# Patient Record
Sex: Female | Born: 1960 | Race: White | Hispanic: No | Marital: Married | Smoking: Never smoker
Health system: Southern US, Community
[De-identification: ages and names within clinical notes are randomized; demographics above are authoritative.]

## PROBLEM LIST (undated history)

## (undated) DIAGNOSIS — R002 Palpitations: Secondary | ICD-10-CM

## (undated) DIAGNOSIS — G35 Multiple sclerosis: Secondary | ICD-10-CM

## (undated) DIAGNOSIS — F32A Depression, unspecified: Secondary | ICD-10-CM

## (undated) DIAGNOSIS — F329 Major depressive disorder, single episode, unspecified: Secondary | ICD-10-CM

## (undated) DIAGNOSIS — E785 Hyperlipidemia, unspecified: Secondary | ICD-10-CM

## (undated) DIAGNOSIS — I4891 Unspecified atrial fibrillation: Secondary | ICD-10-CM

## (undated) DIAGNOSIS — K635 Polyp of colon: Secondary | ICD-10-CM

## (undated) DIAGNOSIS — F419 Anxiety disorder, unspecified: Secondary | ICD-10-CM

## (undated) DIAGNOSIS — E039 Hypothyroidism, unspecified: Secondary | ICD-10-CM

## (undated) DIAGNOSIS — E559 Vitamin D deficiency, unspecified: Secondary | ICD-10-CM

## (undated) DIAGNOSIS — K219 Gastro-esophageal reflux disease without esophagitis: Secondary | ICD-10-CM

## (undated) DIAGNOSIS — M170 Bilateral primary osteoarthritis of knee: Secondary | ICD-10-CM

## (undated) DIAGNOSIS — I10 Essential (primary) hypertension: Secondary | ICD-10-CM

## (undated) DIAGNOSIS — I8390 Asymptomatic varicose veins of unspecified lower extremity: Secondary | ICD-10-CM

## (undated) HISTORY — DX: Polyp of colon: K63.5

## (undated) HISTORY — PX: GASTRIC BYPASS: SHX52

## (undated) HISTORY — DX: Morbid (severe) obesity due to excess calories: E66.01

## (undated) HISTORY — DX: Bilateral primary osteoarthritis of knee: M17.0

## (undated) HISTORY — DX: Vitamin D deficiency, unspecified: E55.9

## (undated) HISTORY — DX: Hyperlipidemia, unspecified: E78.5

## (undated) HISTORY — DX: Unspecified atrial fibrillation: I48.91

## (undated) HISTORY — DX: Palpitations: R00.2

## (undated) HISTORY — PX: CHOLECYSTECTOMY: SHX55

---
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
## 2013-04-17 ENCOUNTER — Encounter (HOSPITAL_COMMUNITY): Payer: Self-pay | Admitting: *Deleted

## 2013-04-17 NOTE — Progress Notes (Signed)
Completed preop instructions on 04/17/13.  [facility] in [place] for clarification of Estradiol , celebrex and progesterone.  Called Dr Sudie Bailey for ekg and stress test results from 2012.

## 2013-04-17 NOTE — Progress Notes (Signed)
Instructed patient to let Dr Charlann Boxer office be aware of sinus infection and started on Zpack 04/16/13.

## 2013-04-17 NOTE — Progress Notes (Signed)
Need orders in EPIC.  Surgery scheduled for 04/19/13.  Thank You.

## 2013-04-17 NOTE — Progress Notes (Signed)
Patient uses [facility] on [address] in [place].  [phone].

## 2013-04-18 NOTE — Progress Notes (Signed)
Surgery scheduled for 04/19/13.  Need orders in EPIC.  Thank You.  

## 2013-04-18 NOTE — Progress Notes (Signed)
Called orthopedic office to enter orders on patient.

## 2013-04-18 NOTE — Progress Notes (Signed)
Patient called back with name of nasal spray and left message on voice mail.  Placed info under home medications.

## 2013-04-19 ENCOUNTER — Encounter (HOSPITAL_COMMUNITY): Payer: Self-pay | Admitting: Pharmacy Technician

## 2013-04-19 ENCOUNTER — Encounter (HOSPITAL_COMMUNITY): Payer: BC Managed Care – PPO | Admitting: Anesthesiology

## 2013-04-19 ENCOUNTER — Ambulatory Visit (HOSPITAL_COMMUNITY): Payer: BC Managed Care – PPO

## 2013-04-19 ENCOUNTER — Encounter (HOSPITAL_COMMUNITY): Payer: Self-pay

## 2013-04-19 ENCOUNTER — Ambulatory Visit (HOSPITAL_COMMUNITY)
Admission: RE | Admit: 2013-04-19 | Discharge: 2013-04-19 | Disposition: A | Discharge status: Home / Self Care | Payer: BC Managed Care – PPO | Source: Ambulatory Visit | Attending: Orthopedic Surgery | Admitting: Orthopedic Surgery

## 2013-04-19 ENCOUNTER — Encounter (HOSPITAL_COMMUNITY)
Admission: RE | Disposition: A | Discharge status: Home / Self Care | Payer: Self-pay | Source: Ambulatory Visit | Attending: Orthopedic Surgery

## 2013-04-19 ENCOUNTER — Ambulatory Visit (HOSPITAL_COMMUNITY): Payer: BC Managed Care – PPO | Admitting: Anesthesiology

## 2013-04-19 DIAGNOSIS — I10 Essential (primary) hypertension: Secondary | ICD-10-CM | POA: Insufficient documentation

## 2013-04-19 DIAGNOSIS — G35 Multiple sclerosis: Secondary | ICD-10-CM | POA: Insufficient documentation

## 2013-04-19 DIAGNOSIS — M94262 Chondromalacia, left knee: Secondary | ICD-10-CM

## 2013-04-19 DIAGNOSIS — Z9884 Bariatric surgery status: Secondary | ICD-10-CM | POA: Insufficient documentation

## 2013-04-19 DIAGNOSIS — X58XXXA Exposure to other specified factors, initial encounter: Secondary | ICD-10-CM | POA: Insufficient documentation

## 2013-04-19 DIAGNOSIS — M2240 Chondromalacia patellae, unspecified knee: Secondary | ICD-10-CM | POA: Insufficient documentation

## 2013-04-19 DIAGNOSIS — M1710 Unilateral primary osteoarthritis, unspecified knee: Secondary | ICD-10-CM | POA: Insufficient documentation

## 2013-04-19 DIAGNOSIS — S83289A Other tear of lateral meniscus, current injury, unspecified knee, initial encounter: Secondary | ICD-10-CM | POA: Insufficient documentation

## 2013-04-19 DIAGNOSIS — IMO0002 Reserved for concepts with insufficient information to code with codable children: Secondary | ICD-10-CM | POA: Insufficient documentation

## 2013-04-19 DIAGNOSIS — E039 Hypothyroidism, unspecified: Secondary | ICD-10-CM | POA: Insufficient documentation

## 2013-04-19 DIAGNOSIS — K219 Gastro-esophageal reflux disease without esophagitis: Secondary | ICD-10-CM | POA: Insufficient documentation

## 2013-04-19 DIAGNOSIS — E669 Obesity, unspecified: Secondary | ICD-10-CM | POA: Insufficient documentation

## 2013-04-19 DIAGNOSIS — Z79899 Other long term (current) drug therapy: Secondary | ICD-10-CM | POA: Insufficient documentation

## 2013-04-19 HISTORY — DX: Asymptomatic varicose veins of unspecified lower extremity: I83.90

## 2013-04-19 HISTORY — DX: Major depressive disorder, single episode, unspecified: F32.9

## 2013-04-19 HISTORY — DX: Multiple sclerosis: G35

## 2013-04-19 HISTORY — DX: Gastro-esophageal reflux disease without esophagitis: K21.9

## 2013-04-19 HISTORY — DX: Hypothyroidism, unspecified: E03.9

## 2013-04-19 HISTORY — DX: Depression, unspecified: F32.A

## 2013-04-19 HISTORY — DX: Anxiety disorder, unspecified: F41.9

## 2013-04-19 HISTORY — DX: Essential (primary) hypertension: I10

## 2013-04-19 HISTORY — PX: KNEE ARTHROSCOPY: SHX127

## 2013-04-19 LAB — BASIC METABOLIC PANEL
CO2: 27 mEq/L (ref 19–32)
Calcium: 9.2 mg/dL (ref 8.4–10.5)
GFR calc Af Amer: 80 mL/min — ABNORMAL LOW (ref >90)
GFR calc non Af Amer: 69 mL/min — ABNORMAL LOW (ref >90)
Sodium: 137 mEq/L (ref 135–145)

## 2013-04-19 LAB — CBC
MCH: 30 pg (ref 26.0–34.0)
MCHC: 34.5 g/dL (ref 30.0–36.0)
Platelets: 241 10*3/uL (ref 150–400)
RDW: 13.7 % (ref 11.5–15.5)

## 2013-04-19 SURGERY — ARTHROSCOPY, KNEE
Anesthesia: General | Site: Knee | Laterality: Left

## 2013-04-19 MED ORDER — BUPIVACAINE-EPINEPHRINE PF 0.25-1:200000 % IJ SOLN
INTRAMUSCULAR | Status: AC
  Filled 2013-04-19: qty 30

## 2013-04-19 MED ORDER — SUCCINYLCHOLINE CHLORIDE 20 MG/ML IJ SOLN
INTRAMUSCULAR | Status: AC
  Filled 2013-04-19: qty 1

## 2013-04-19 MED ORDER — LIDOCAINE-EPINEPHRINE 1 %-1:100000 IJ SOLN
INTRAMUSCULAR | Status: AC
  Filled 2013-04-19: qty 1

## 2013-04-19 MED ORDER — ASPIRIN EC 325 MG PO TBEC: 325.0000 mg | DELAYED_RELEASE_TABLET | Freq: Every day | ORAL | Status: DC

## 2013-04-19 MED ORDER — PROMETHAZINE HCL 25 MG/ML IJ SOLN: 6.2500 mg | INTRAMUSCULAR | Status: DC | PRN

## 2013-04-19 MED ORDER — KETOROLAC TROMETHAMINE 30 MG/ML IJ SOLN: 15.0000 mg | Freq: Once | INTRAMUSCULAR | Status: DC | PRN

## 2013-04-19 MED ORDER — ONDANSETRON HCL 4 MG/2ML IJ SOLN
INTRAMUSCULAR | Status: DC | PRN
  Administered 2013-04-19: 4 mg via INTRAVENOUS

## 2013-04-19 MED ORDER — MIDAZOLAM HCL 5 MG/5ML IJ SOLN
INTRAMUSCULAR | Status: DC | PRN
  Administered 2013-04-19: 2 mg via INTRAVENOUS

## 2013-04-19 MED ORDER — FENTANYL CITRATE 0.05 MG/ML IJ SOLN: 25.0000 ug | INTRAMUSCULAR | Status: DC | PRN

## 2013-04-19 MED ORDER — CEFAZOLIN SODIUM-DEXTROSE 2-3 GM-% IV SOLR
INTRAVENOUS | Status: AC
  Filled 2013-04-19: qty 50

## 2013-04-19 MED ORDER — LIDOCAINE HCL (CARDIAC) 20 MG/ML IV SOLN
INTRAVENOUS | Status: AC
  Filled 2013-04-19: qty 5

## 2013-04-19 MED ORDER — PROPOFOL 10 MG/ML IV BOLUS
INTRAVENOUS | Status: DC | PRN
  Administered 2013-04-19: 200 mg via INTRAVENOUS

## 2013-04-19 MED ORDER — LACTATED RINGERS IR SOLN
Status: DC | PRN
  Administered 2013-04-19: 9000 mL

## 2013-04-19 MED ORDER — LACTATED RINGERS IV SOLN
INTRAVENOUS | Status: DC | PRN
  Administered 2013-04-19: 07:00:00 via INTRAVENOUS

## 2013-04-19 MED ORDER — LIDOCAINE HCL (CARDIAC) 20 MG/ML IV SOLN
INTRAVENOUS | Status: DC | PRN
  Administered 2013-04-19: 100 mg via INTRAVENOUS

## 2013-04-19 MED ORDER — FENTANYL CITRATE 0.05 MG/ML IJ SOLN
INTRAMUSCULAR | Status: AC
  Filled 2013-04-19: qty 2

## 2013-04-19 MED ORDER — HYDROCODONE-ACETAMINOPHEN 5-325 MG PO TABS: 1.0000 | ORAL_TABLET | Freq: Four times a day (QID) | ORAL | Status: DC | PRN

## 2013-04-19 MED ORDER — PROPOFOL 10 MG/ML IV BOLUS
INTRAVENOUS | Status: AC
  Filled 2013-04-19: qty 20

## 2013-04-19 MED ORDER — METHOCARBAMOL 500 MG PO TABS: 500.0000 mg | ORAL_TABLET | Freq: Four times a day (QID) | ORAL | Status: DC

## 2013-04-19 MED ORDER — MIDAZOLAM HCL 2 MG/2ML IJ SOLN
INTRAMUSCULAR | Status: AC
Start: 2013-04-19 — End: 2013-04-19
  Filled 2013-04-19: qty 2

## 2013-04-19 MED ORDER — ROCURONIUM BROMIDE 100 MG/10ML IV SOLN
INTRAVENOUS | Status: AC
  Filled 2013-04-19: qty 1

## 2013-04-19 MED ORDER — FENTANYL CITRATE 0.05 MG/ML IJ SOLN
INTRAMUSCULAR | Status: DC | PRN
  Administered 2013-04-19 (×2): 50 ug via INTRAVENOUS

## 2013-04-19 MED ORDER — MELOXICAM 7.5 MG PO TABS: 15.0000 mg | ORAL_TABLET | Freq: Every day | ORAL | Status: DC

## 2013-04-19 MED ORDER — BUPIVACAINE-EPINEPHRINE 0.25% -1:200000 IJ SOLN
INTRAMUSCULAR | Status: DC | PRN
  Administered 2013-04-19: 30 mL

## 2013-04-19 MED ORDER — LIDOCAINE-EPINEPHRINE 1 %-1:100000 IJ SOLN
INTRAMUSCULAR | Status: DC | PRN
  Administered 2013-04-19: 20 mL

## 2013-04-19 MED ORDER — KETOROLAC TROMETHAMINE 30 MG/ML IJ SOLN
INTRAMUSCULAR | Status: AC
  Administered 2013-04-19: 30 mg
  Filled 2013-04-19: qty 1

## 2013-04-19 MED ORDER — DEXAMETHASONE SODIUM PHOSPHATE 10 MG/ML IJ SOLN
INTRAMUSCULAR | Status: DC | PRN
  Administered 2013-04-19: 10 mg via INTRAVENOUS

## 2013-04-19 MED ORDER — CEFAZOLIN SODIUM-DEXTROSE 2-3 GM-% IV SOLR
2.0000 g | INTRAVENOUS | Status: AC
  Administered 2013-04-19: 2 g via INTRAVENOUS

## 2013-04-19 SURGICAL SUPPLY — 20 items
BANDAGE ELASTIC 6 VELCRO ST LF (GAUZE/BANDAGES/DRESSINGS) ×2 IMPLANT
BLADE CUDA SHAVER 3.5 (BLADE) ×2 IMPLANT
DRAPE U-SHAPE 47X51 STRL (DRAPES) IMPLANT
DRSG EMULSION OIL 3X3 NADH (GAUZE/BANDAGES/DRESSINGS) ×2 IMPLANT
DURAPREP 26ML APPLICATOR (WOUND CARE) ×2 IMPLANT
GLOVE BIOGEL PI IND STRL 7.5 (GLOVE) ×1 IMPLANT
GLOVE BIOGEL PI INDICATOR 7.5 (GLOVE) ×1
GLOVE ORTHO TXT STRL SZ7.5 (GLOVE) ×2 IMPLANT
GOWN PREVENTION PLUS LG XLONG (DISPOSABLE) ×2 IMPLANT
MANIFOLD NEPTUNE II (INSTRUMENTS) ×2 IMPLANT
PACK ARTHROSCOPY WL (CUSTOM PROCEDURE TRAY) ×2 IMPLANT
PAD ABD 8X10 STRL (GAUZE/BANDAGES/DRESSINGS) ×2 IMPLANT
PADDING CAST COTTON 6X4 STRL (CAST SUPPLIES) ×2 IMPLANT
POSITIONER SURGICAL ARM (MISCELLANEOUS) IMPLANT
SET ARTHROSCOPY TUBING (MISCELLANEOUS) ×1
SET ARTHROSCOPY TUBING LN (MISCELLANEOUS) ×1 IMPLANT
SPONGE GAUZE 4X4 12PLY (GAUZE/BANDAGES/DRESSINGS) ×2 IMPLANT
SUT ETHILON 4 0 PS 2 18 (SUTURE) ×2 IMPLANT
TOWEL OR 17X26 10 PK STRL BLUE (TOWEL DISPOSABLE) ×2 IMPLANT
WRAP KNEE MAXI GEL POST OP (GAUZE/BANDAGES/DRESSINGS) ×2 IMPLANT

## 2013-04-19 NOTE — Anesthesia Preprocedure Evaluation (Addendum)
Anesthesia Evaluation  Patient identified by MRN, date of birth, ID band Patient awake    Reviewed: Allergy & Precautions, H&P , NPO status , Patient's Chart, lab work & pertinent test results  Airway Mallampati: II TM Distance: >3 FB Neck ROM: Full    Dental no notable dental hx.    Pulmonary neg pulmonary ROS,  breath sounds clear to auscultation  Pulmonary exam normal       Cardiovascular hypertension, Pt. on medications Rhythm:Regular Rate:Normal     Neuro/Psych MS negative psych ROS   GI/Hepatic negative GI ROS, Neg liver ROS,   Endo/Other  Hypothyroidism Morbid obesity  Renal/GU negative Renal ROS  negative genitourinary   Musculoskeletal negative musculoskeletal ROS (+)   Abdominal   Peds negative pediatric ROS (+)  Hematology negative hematology ROS (+)   Anesthesia Other Findings   Reproductive/Obstetrics negative OB ROS                           Anesthesia Physical Anesthesia Plan  ASA: III  Anesthesia Plan: General   Post-op Pain Management:    Induction: Intravenous  Airway Management Planned: LMA  Additional Equipment:   Intra-op Plan:   Post-operative Plan:   Informed Consent: I have reviewed the patients History and Physical, chart, labs and discussed the procedure including the risks, benefits and alternatives for the proposed anesthesia with the patient or authorized representative who has indicated his/her understanding and acceptance.   Dental advisory given  Plan Discussed with: CRNA and Surgeon  Anesthesia Plan Comments:        Anesthesia Quick Evaluation

## 2013-04-19 NOTE — Anesthesia Postprocedure Evaluation (Signed)
  Anesthesia Post-op Note  Patient: Angela Coffey  Procedure(s) Performed: Procedure(s) (LRB): LEFT KNEE ARTHROSCOPY AND DEBRIDEMENT, PARTIAL MEDIAL AND LATERAL MENISCECTOMY AND MEDIAL PATELLOFEMORAL AND LATERAL CHONDROPLASTY  (Left)  Patient Location: PACU  Anesthesia Type: General  Level of Consciousness: awake and alert   Airway and Oxygen Therapy: Patient Spontanous Breathing  Post-op Pain: mild  Post-op Assessment: Post-op Vital signs reviewed, Patient's Cardiovascular Status Stable, Respiratory Function Stable, Patent Airway and No signs of Nausea or vomiting  Last Vitals:  Filed Vitals:   04/19/13 0930  BP: 125/78  Pulse: 62  Temp: 36.5 C  Resp: 18    Post-op Vital Signs: stable   Complications: No apparent anesthesia complications

## 2013-04-19 NOTE — Preoperative (Signed)
Beta Blockers   Reason not to administer Beta Blockers:Patient took Bystolic at [facility] 04/18/13. HR 40's. no other BB needed.

## 2013-04-19 NOTE — H&P (Addendum)
CC- Angela Coffey is a 52 y.o. female who presents with left knee pain.   HPI- . Knee Pain: Patient presents for follow up on a knee problem involving the  left knee. Onset of the symptoms was several months ago. Inciting event: none known. Current symptoms include crepitus sensation, giving out, locking, pain located both medially and laterally and swelling. Pain is aggravated by going up and down stairs, lateral movements, pivoting and squatting.  Patient has had no prior knee problems. Evaluation to date: plain films: mild joint space narrowing medially and laterally without significant osteophytic changes and MRI: degenerative joint changes plus lateral meniscal tearing. Treatment to date: avoidance of offending activity, corticosteroid injection which was somewhat effective, prescription NSAIDS which are not very effective and PT which was not very effective.  Past Medical History  Diagnosis Date  . Hypertension   . Hypothyroidism   . Anxiety   . Depression   . Varicose veins   . GERD (gastroesophageal reflux disease)   . Multiple sclerosis     Past Surgical History  Procedure Laterality Date  . Gastric bypass    . Tonsillectomy  1971    Prior to Admission medications   Medication Sig Start Date End Date Taking? Authorizing Provider  acetaminophen (TYLENOL) 500 MG tablet Take 500 mg by mouth every 6 (six) hours as needed. Patient takes 2 tablets when she takes Rebif   Yes Historical Provider, MD  azithromycin (ZITHROMAX Z-PAK) 250 MG tablet Take by mouth daily. Patient started on 04/16/13.   Yes Historical Provider, MD  B Complex-C (B-COMPLEX WITH VITAMIN C) tablet Take 1 tablet by mouth daily.   Yes Historical Provider, MD  buPROPion (WELLBUTRIN XL) 150 MG 24 hr tablet Take 150 mg by mouth daily. Patient takes in the am   Yes Historical Provider, MD  celecoxib (CELEBREX) 200 MG capsule Take 200 mg by mouth. Patient takes as needed   Yes Historical Provider, MD  cholecalciferol  (VITAMIN D) 1000 UNITS tablet Take 2,000 Units by mouth daily.   Yes Historical Provider, MD  escitalopram (LEXAPRO) 20 MG tablet Take 20 mg by mouth daily. Patient takes at nite   Yes Historical Provider, MD  esomeprazole (NEXIUM) 20 MG capsule Take 20 mg by mouth 2 (two) times daily.   Yes Historical Provider, MD  estradiol (ESTRACE) 0.5 MG tablet Take 0.5 mg by mouth daily. Patient takes at nite   Yes Historical Provider, MD  Ibuprofen (MOTRIN PO) Take by mouth. Patient takes 200mg  as needed   Yes Historical Provider, MD  interferon beta-1a (REBIF) 44 MCG/0.5ML injection Inject 44 mcg into the skin 3 (three) times a week. Patient does on Sunday, Tuesday, and Thursday at nite   Yes Historical Provider, MD  levothyroxine (SYNTHROID, LEVOTHROID) 200 MCG tablet Take 200 mcg by mouth daily before breakfast.   Yes Historical Provider, MD  nebivolol (BYSTOLIC) 5 MG tablet Take 5 mg by mouth daily. Patient takes at nite   Yes Historical Provider, MD  PRESCRIPTION MEDICATION Q nasal nasal spray 18mcg/spray - 2 sprays each nostril daily per patient 04/09/13  Yes Historical Provider, MD  progesterone (PROMETRIUM) 200 MG capsule Take 200 mg by mouth daily. Patient takes at nite   Yes Historical Provider, MD    antalgic gait, soft tissue tenderness over lateral joint line, reduced range of motion, collateral ligaments intact, normal ipsilateral hip exam, normal contralateral knee exam  Physical Examination: General appearance - alert, well appearing, and in no distress and overweight Mental  status - alert, oriented to person, place, and time Chest - clear to auscultation, no wheezes, rales or rhonchi, symmetric air entry Heart - normal rate and regular rhythm Abdomen - not examined obese Musculoskeletal - see above Extremities - peripheral pulses normal, no pedal edema, no clubbing or cyanosis Skin - normal coloration and turgor, no rashes, no suspicious skin lesions noted   Asessment/Plan--- Left knee  lateral meniscal tear and degenerative joint changes in an obese female with sleep apnea concerns  - - Plan left knee arthroscopy with partial meniscectomy and chondroplasty/debridement. Procedure risks and potential comps discussed with patient who elects to proceed. Goals are decreased pain and increased function with a high likelihood of achieving both   Surgical procedure performed in hospital setting as it felt that she was unfit for procedure to be done in outpatient arena upon anesthetic evaluation due to medical history of MS, obesity and others per her report.

## 2013-04-19 NOTE — Brief Op Note (Signed)
04/19/2013  8:31 AM  PATIENT:  Angela Coffey  51 y.o. female  PRE-OPERATIVE DIAGNOSIS:  LEFT KNEE MEDIAL AND LATERAL MENISCAL TEAR AND OSTEOARTHRITIS  POST-OPERATIVE DIAGNOSIS:  LEFT KNEE MEDIAL AND LATERAL MENISCAL TEAR AND OSTEOARTHRITIS, CHONDROMALACIA  PROCEDURE:  Procedure(s): LEFT KNEE ARTHROSCOPY with PARTIAL MEDIAL AND LATERAL MENISCECTOMY AND MEDIAL, PATELLOFEMORA,L AND LATERAL CHONDROPLASTY  (Left)  SURGEON:  Surgeon(s) and Role:    * Shelda Pal, MD - Primary  PHYSICIAN ASSISTANT: No  ANESTHESIA:   general  EBL:  Total I/O In: 600 [I.V.:600] Out: -   BLOOD ADMINISTERED:none  DRAINS: none   LOCAL MEDICATIONS USED:  MARCAINE     SPECIMEN:  No Specimen  DISPOSITION OF SPECIMEN:  N/A  COUNTS:  YES  TOURNIQUET:  * No tourniquets in log *  DICTATION: .Other Dictation: Dictation Number [id number]  PLAN OF CARE: Discharge to home after PACU  PATIENT DISPOSITION:  PACU - hemodynamically stable.   Delay start of Pharmacological VTE agent (>24hrs) due to surgical blood loss or risk of bleeding: no

## 2013-04-19 NOTE — Transfer of Care (Signed)
Immediate Anesthesia Transfer of Care Note  Patient: Angela Coffey  Procedure(s) Performed: Procedure(s): LEFT KNEE ARTHROSCOPY AND DEBRIDEMENT, PARTIAL MEDIAL AND LATERAL MENISCECTOMY AND MEDIAL PATELLOFEMORAL AND LATERAL CHONDROPLASTY  (Left)  Patient Location: PACU  Anesthesia Type:General  Level of Consciousness: sedated  Airway & Oxygen Therapy: Patient Spontanous Breathing and Patient connected to nasal cannula oxygen  Post-op Assessment: Report given to PACU RN and Post -op Vital signs reviewed and stable  Post vital signs: Reviewed and stable  Complications: No apparent anesthesia complications

## 2013-04-20 ENCOUNTER — Encounter (HOSPITAL_COMMUNITY): Payer: Self-pay | Admitting: Orthopedic Surgery

## 2013-04-20 NOTE — Op Note (Signed)
NAMEBREANA, Angela Coffey             ACCOUNT NO.:  [id number]  MEDICAL RECORD NO.:  [id number]  LOCATION:  [facility]                         FACILITY:  [facility]  PHYSICIAN:  Madlyn Frankel. Charlann Boxer, M.D.  DATE OF BIRTH:  1961-04-22  DATE OF PROCEDURE:  04/19/2013 DATE OF DISCHARGE:  04/19/2013                              OPERATIVE REPORT   PREOPERATIVE DIAGNOSIS:  Left knee lateral meniscal tear associated with degenerative changes.  POSTOPERATIVE DIAGNOSES/FINDINGS: 1. Posterior horn medial meniscal tear. 2. Midbody anterior horn lateral meniscal tear 3. 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  PROCEDURES: 1. Left knee diagnostic and operative arthroscopy with medial and     lateral partial meniscectomies. 2. Medial, lateral, and patellofemoral chondroplasties with chondral     debridement.  No evidence of eburnated bone therefore no abrasion     chondroplasty nor microfracture carried out.  SURGEON:  Madlyn Frankel. Charlann Boxer, MD  ASSISTANT:  Surgical Team.  ANESTHESIA:  General.  SPECIMENS:  None.  COMPLICATIONS:  None.  DRAINS:  None.  TOURNIQUET:  Not utilized.  INDICATIONS:  Ms. Domino is a 52 year old female, who had been followed in the office for recurrent mechanical symptoms in the left knee.  She had been having problems for up to 8 months prior to.  She had failed conservative measures of injections.  MRI had revealed concern for lateral meniscal pathology, as well as some degenerative changes. Radiographs were relatively normal.  Given the persistence of her symptoms and failure to respond to conservative measures as well as the findings noted in her preoperative workup, she at this point is ready to proceed with surgical intervention.  She was not in need of knee arthroplasty radiographically.  Her symptoms were more mechanical.  The risks of further progression of arthritis, persistent problems, the risk of her medical  comorbidities, the benefits of pain relief were discussed.  The potential risks of persistent problems were issued.  Her surgery was going to be scheduled at the hospital based on her medical comorbidities including multiple sclerosis, obesity, and concerns for sleep apnea issues.  PROCEDURE IN DETAIL:  The patient was brought to the operative theater. Once adequate anesthesia, preoperative antibiotics, Ancef 2 g were administered, she was positioned in the supine with the left leg in a leg holder.  Left lower extremity was then prepped and draped in sterile fashion.  Time-out was performed, identifying the patient, planned procedure, and extremity.  Standard inferomedial, superomedial, inferolateral portals were utilized.  Diagnostic evaluation of the knee revealed the above findings.  The inferior medial portal was able to be used as a sole working portal. The medial compartment was addressed first.  Using a combination of a small straight-biting basket and a 3.5 Cuda shaver, the meniscus was debrided back into a stable level, removing approximately 15% to 20% of the posterior horn meniscus to blend in with a medial meniscus mid body. Debridement of chondroplasty was carried out in the medial femoral condyle in the distal to posterior aspect of the weightbearing surface. Tibia surface was noted to be soft, but without significant defect.  As I moved laterally, we identified an intact anterior  cruciate ligament.  The lateral compartment revealed meniscal tearing as identified by MRI in the mid body anterior horn junction which was debrided with a straight biting basket and then blended in with a 3.5 Cuda shaver.  In addition, chondroplasty was carried out with some associated chondral defects, probably related to meniscal pathology in this distal weightbearing surface of the lateral femoral condyle.  This was taken back to stable level, again no evidence of eburnated  bone.  Following this, I did perform a plica excision or synovial band excision over the medial aspect of the knee and then attended to the anterior compartment where we identified on the patella apex grade 2 to 3 chondromalacia, but this was more unfortunately associated with fairly significant grade 3 changes in the trochlear region.  I tried to stabilize this back to a stable edges along the borders as it was probably a centimeter type of fissuring within the trochlea again no evidence of eburnated bone thus no microfracture abrasion was necessary and required.  Following these above-stated procedures, I reexamined the knee to make certain that I was satisfied with the above stability of the remaining cartilage segments.  Once this was done, the instrumentation was removed.  Portal sites were reapproximated using 4-0 nylon.  Knee was injected at the end of the case with 30 mL of 0.25% Marcaine with epinephrine.  The knee was then wrapped into a sterile bulky dressing.  She was brought to the recovery room in stable condition, tolerating the procedure well.     Madlyn Frankel Charlann Boxer, M.D.     MDO/MEDQ  D:  04/19/2013  T:  04/20/2013  Job:  [id number]

## 2014-05-22 HISTORY — PX: CHOLECYSTECTOMY: SHX55

## 2014-05-28 ENCOUNTER — Telehealth: Payer: Self-pay | Admitting: Vascular Surgery

## 2014-05-28 NOTE — Telephone Encounter (Signed)
Patient left a message that she would be interested in scheduling an appointment for VV's. Return call to [phone].  I spoke with patients husband who will let her know that I returned the call. dpm

## 2014-11-01 ENCOUNTER — Other Ambulatory Visit: Payer: Self-pay | Admitting: *Deleted

## 2014-11-01 DIAGNOSIS — I83893 Varicose veins of bilateral lower extremities with other complications: Secondary | ICD-10-CM

## 2015-01-15 ENCOUNTER — Encounter: Payer: Self-pay | Admitting: Vascular Surgery

## 2015-01-16 ENCOUNTER — Ambulatory Visit (INDEPENDENT_AMBULATORY_CARE_PROVIDER_SITE_OTHER): Payer: BC Managed Care – PPO | Admitting: Vascular Surgery

## 2015-01-16 ENCOUNTER — Ambulatory Visit (HOSPITAL_COMMUNITY)
Admission: RE | Admit: 2015-01-16 | Discharge: 2015-01-16 | Disposition: A | Discharge status: Home / Self Care | Payer: BC Managed Care – PPO | Source: Ambulatory Visit | Attending: Vascular Surgery | Admitting: Vascular Surgery

## 2015-01-16 ENCOUNTER — Encounter: Payer: Self-pay | Admitting: Vascular Surgery

## 2015-01-16 VITALS — BP 135/83 | HR 66 | Temp 98.4°F | Resp 18 | Ht 62.5 in | Wt 267.0 lb

## 2015-01-16 DIAGNOSIS — I10 Essential (primary) hypertension: Secondary | ICD-10-CM | POA: Insufficient documentation

## 2015-01-16 DIAGNOSIS — I83813 Varicose veins of bilateral lower extremities with pain: Secondary | ICD-10-CM | POA: Diagnosis not present

## 2015-01-16 DIAGNOSIS — I83893 Varicose veins of bilateral lower extremities with other complications: Secondary | ICD-10-CM | POA: Insufficient documentation

## 2015-01-16 NOTE — Progress Notes (Signed)
[facility] HISTORY AND PHYSICAL   History of Present Illness:  Patient is a 54 y.o. year old female who presents for evaluation of several year history of symptomatically varicose veins. She actually was seen at [facility] in 2010 and offered a laser ablation at that time but the patient was reluctant to proceed. She returns today. She has similar complaints to 2010. She states that her legs feel full heavy and achy as the day proceeds. She also has skin sensitivity to several of the varicosities when something touches her skin. She denies prior history of DVT or bleeding or thrombosis of her varicose veins. Her mother and father both of varicose veins..  Other medical problems include obesity, hypertension, anxiety, depression and multiple sclerosis all of which are currently stable. The patient did have a gastric bypass several years ago. She has regained and exceeded her preoperative weight. Discussions were held today regarding measures to improve weight loss as a part of her management of her venous disease.  Past Medical History  Diagnosis Date  . Hypertension   . Hypothyroidism   . Anxiety   . Depression   . Varicose veins   . GERD (gastroesophageal reflux disease)   . Multiple sclerosis     Past Surgical History  Procedure Laterality Date  . Gastric bypass    . Tonsillectomy  1971  . Knee arthroscopy Left 04/19/2013    Procedure: LEFT KNEE ARTHROSCOPY AND DEBRIDEMENT, PARTIAL MEDIAL AND LATERAL MENISCECTOMY AND MEDIAL PATELLOFEMORAL AND LATERAL CHONDROPLASTY ;  Surgeon: Shelda Pal, MD;  Location: [facility];  Service: Orthopedics;  Laterality: Left;    Social History Social History  Substance Use Topics  . Smoking status: Never Smoker   . Smokeless tobacco: Never Used  . Alcohol Use: No    Family History No family history on file.  Allergies  No Known Allergies   Current Outpatient Prescriptions  Medication Sig Dispense Refill  .  acetaminophen (TYLENOL) 500 MG tablet Take 500 mg by mouth every 6 (six) hours as needed. Patient takes 2 tablets when she takes Rebif    . aspirin EC 325 MG tablet Take 1 tablet (325 mg total) by mouth daily. 30 tablet 0  . B Complex-C (B-COMPLEX WITH VITAMIN C) tablet Take 1 tablet by mouth daily.    . baclofen (LIORESAL) 10 MG tablet Take 10 mg by mouth at bedtime as needed for muscle spasms.    Marland Kitchen buPROPion (WELLBUTRIN XL) 150 MG 24 hr tablet Take 150 mg by mouth daily. Patient takes in the am    . cholecalciferol (VITAMIN D) 1000 UNITS tablet Take 2,000 Units by mouth daily.    Marland Kitchen escitalopram (LEXAPRO) 20 MG tablet Take 20 mg by mouth daily. Patient takes at nite    . esomeprazole (NEXIUM) 20 MG capsule Take 20 mg by mouth 2 (two) times daily.    Marland Kitchen estradiol (ESTRACE) 0.5 MG tablet Take 0.5 mg by mouth daily. Patient takes at nite    . HYDROcodone-acetaminophen (NORCO) 5-325 MG per tablet Take 1-2 tablets by mouth every 6 (six) hours as needed. 90 tablet 0  . Ibuprofen (MOTRIN PO) Take by mouth. Patient takes 200mg  as needed    . interferon beta-1a (REBIF) 44 MCG/0.5ML injection Inject 44 mcg into the skin 3 (three) times a week. Patient does on Sunday, Tuesday, and Thursday at [facility] [facility]    . levothyroxine (SYNTHROID, LEVOTHROID) 200 MCG tablet Take 200 mcg by mouth daily before breakfast.    .  nebivolol (BYSTOLIC) 5 MG tablet Take 5 mg by mouth daily. Patient takes at nite    . PRESCRIPTION MEDICATION Q nasal nasal spray 31mcg/spray - 2 sprays each nostril daily per patient    . progesterone (PROMETRIUM) 200 MG capsule Take 200 mg by mouth daily. Patient takes at nite    . azithromycin (ZITHROMAX Z-PAK) 250 MG tablet Take by mouth daily. Patient started on 04/16/13.    . celecoxib (CELEBREX) 200 MG capsule Take 200 mg by mouth. Patient takes as needed    . meloxicam (MOBIC) 7.5 MG tablet Take 2 tablets (15 mg total) by mouth daily. Take daily for first 7-10 days then as needed for pain and swelling  (Patient not taking: Reported on 01/16/2015) 90 tablet 1  . methocarbamol (ROBAXIN) 500 MG tablet Take 1 tablet (500 mg total) by mouth 4 (four) times daily. (Patient not taking: Reported on 01/16/2015) 80 tablet 1   No current facility-administered medications for this visit.    ROS:   General:  No weight loss, Fever, chills  HEENT: No recent headaches, no nasal bleeding, no visual changes, no sore throat  Neurologic: No dizziness, blackouts, seizures. No recent symptoms of stroke or mini- stroke. No recent episodes of slurred speech, or temporary blindness.  Cardiac: No recent episodes of chest pain/pressure, no shortness of breath at rest.  No shortness of breath with exertion.  Denies history of atrial fibrillation or irregular heartbeat  Vascular: No history of rest pain in feet.  No history of claudication.  No history of non-healing ulcer, No history of DVT   Pulmonary: No home oxygen, no productive cough, no hemoptysis,  No asthma or wheezing  Musculoskeletal:  [ ]  Arthritis, [ ]  Low back pain,  [ ]  Joint pain  Hematologic:No history of hypercoagulable state.  No history of easy bleeding.  No history of anemia  Gastrointestinal: No hematochezia or melena,  No gastroesophageal reflux, no trouble swallowing  Urinary: [ ]  chronic Kidney disease, [ ]  on HD - [ ]  MWF or [ ]  TTHS, [ ]  Burning with urination, [ ]  Frequent urination, [ ]  Difficulty urinating;   Skin: No rashes  Psychological: No history of anxiety,  No history of depression   Physical Examination  Filed Vitals:   01/16/15 1315  BP: 135/83  Pulse: 66  Temp: 98.4 F (36.9 C)  TempSrc: Oral  Resp: 18  Height: 5' 2.5" (1.588 m)  Weight: 267 lb (121.11 kg)  SpO2: 99%    Body mass index is 48.03 kg/(m^2).  General:  Alert and oriented, no acute distress HEENT: Normal Neck: No bruit or JVD Pulmonary: Clear to auscultation bilaterally Cardiac: Regular Rate and Rhythm without murmur Abdomen: Soft,  non-tender, non-distended, no mass, obese  Skin: No rash, large diffuse clusters of varicosities especially on the left medial calf and thigh and right anterior thigh and over the entire course of the greater saphenous vein. These vary in size from 4-8 mm in diameter. Extremity Pulses:  2+ radial, brachial, femoral, dorsalis pedis, posterior tibial pulses bilaterally Musculoskeletal: No deformity or edema  Neurologic: Upper and lower extremity motor 5/5 and symmetric  DATA:  Patient had bilateral lower extremity venous reflux exam today. She had diffuse reflux throughout the right greater saphenous vein. Vein diameter was 6-20 mm. She also had deep vein reflux in the common femoral and superficial femoral veins. In the left greater saphenous vein diameter was 5-8 mm also with deep vein reflux in the common femoral vein  ASSESSMENT:  Bilateral symptomatic varicose veins   PLAN:  Patient was placed in bilateral lower extremity Ace wraps today and instructed on how to place these. She will wear these at all times during the day. She has been fitted for compression stockings in the past but due to the size and shape of her leg from obesity these do not fit properly. She will continue to wear the Ace wrap for symptomatic relief. She also elevate her legs at the end of the day and take non-steroid any inflammatory is as needed for pain. She will also try continued weight loss plan. She will follow-up in 3 months time for consideration of laser ablation.  Fabienne Bruns, MD [facility] [place] Office: [phone] Pager: [phone]

## 2015-04-22 ENCOUNTER — Ambulatory Visit: Payer: BC Managed Care – PPO | Admitting: Vascular Surgery

## 2015-05-06 ENCOUNTER — Encounter: Payer: Self-pay | Admitting: Vascular Surgery

## 2015-05-13 ENCOUNTER — Ambulatory Visit: Payer: BC Managed Care – PPO | Admitting: Vascular Surgery

## 2015-05-23 ENCOUNTER — Encounter: Payer: Self-pay | Admitting: Vascular Surgery

## 2015-05-27 ENCOUNTER — Ambulatory Visit (INDEPENDENT_AMBULATORY_CARE_PROVIDER_SITE_OTHER): Payer: BC Managed Care – PPO | Admitting: Vascular Surgery

## 2015-05-27 ENCOUNTER — Encounter: Payer: Self-pay | Admitting: Vascular Surgery

## 2015-05-27 VITALS — BP 117/75 | HR 69 | Temp 97.6°F | Resp 14 | Ht 62.5 in | Wt 263.0 lb

## 2015-05-27 DIAGNOSIS — I83813 Varicose veins of bilateral lower extremities with pain: Secondary | ICD-10-CM

## 2015-05-27 NOTE — Progress Notes (Signed)
Angela Coffey presents today for continued discussion regarding her bilateral lower extremity venous hypertension and pain. She is a morbidly obese and is on been unable to wear compression garments. She has attempted wrapping but this has not given her any relief. 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 She has no relief with elevation and non-steroidal anti-inflammatory.  Past Medical History  Diagnosis Date  . Hypertension   . Hypothyroidism   . Anxiety   . Depression   . Varicose veins   . GERD (gastroesophageal reflux disease)   . Multiple sclerosis (HCC)     Social History  Substance Use Topics  . Smoking status: Never Smoker   . Smokeless tobacco: Never Used  . Alcohol Use: No    History reviewed. No pertinent family history.  No Known Allergies   Current outpatient prescriptions:  .  acetaminophen (TYLENOL) 500 MG tablet, Take 500 mg by mouth every 6 (six) hours as needed. Patient takes 2 tablets when she takes Rebif, Disp: , Rfl:  .  aspirin EC 325 MG tablet, Take 1 tablet (325 mg total) by mouth daily., Disp: 30 tablet, Rfl: 0 .  B Complex-C (B-COMPLEX WITH VITAMIN C) tablet, Take 1 tablet by mouth daily., Disp: , Rfl:  .  baclofen (LIORESAL) 10 MG tablet, Take 10 mg by mouth at bedtime as needed for muscle spasms., Disp: , Rfl:  .  buPROPion (WELLBUTRIN XL) 150 MG 24 hr tablet, Take 300 mg by mouth daily. Patient takes in the am, Disp: , Rfl:  .  cholecalciferol (VITAMIN D) 1000 UNITS tablet, Take 2,000 Units by mouth daily., Disp: , Rfl:  .  escitalopram (LEXAPRO) 20 MG tablet, Take 20 mg by mouth daily. Patient takes at nite, Disp: , Rfl:  .  esomeprazole (NEXIUM) 20 MG capsule, Take 20 mg by mouth 2 (two) times daily., Disp: , Rfl:  .  Ibuprofen (MOTRIN PO), Take by mouth. Patient takes  as needed, Disp: , Rfl:  .   interferon beta-1a (REBIF) 44 MCG/0.5ML injection, Inject 44 mcg into the skin 3 (three) times a week. Patient does on Sunday, Tuesday, and Thursday at nite, Disp: , Rfl:  .  levothyroxine (SYNTHROID, LEVOTHROID) 200 MCG tablet, Take 200 mcg by mouth daily before breakfast., Disp: , Rfl:  .  levothyroxine (SYNTHROID, LEVOTHROID) 25 MCG tablet, Take 25 mcg by mouth daily before breakfast., Disp: , Rfl:  .  nebivolol (BYSTOLIC) 5 MG tablet, Take 5 mg by mouth daily. Patient takes at nite, Disp: , Rfl:  .  PRESCRIPTION MEDICATION, Reported on 05/27/2015, Disp: , Rfl:  .  azithromycin (ZITHROMAX Z-PAK) 250 MG tablet, Take by mouth daily. Reported on 05/27/2015, Disp: , Rfl:  .  celecoxib (CELEBREX) 200 MG capsule, Take 200 mg by mouth. Reported on 05/27/2015, Disp: , Rfl:  .  estradiol (ESTRACE) 0.5 MG tablet, Take 0.5 mg by mouth daily. Reported on 05/27/2015, Disp: , Rfl:  .  HYDROcodone-acetaminophen (NORCO) 5-325 MG per tablet, Take 1-2 tablets by mouth every 6 (six) hours as needed. (Patient not taking: Reported on 05/27/2015), Disp: 90 tablet, Rfl: 0 .  meloxicam (MOBIC) 7.5 MG tablet, Take 2 tablets (15 mg total) by mouth daily. Take daily for first 7-10 days then as needed for pain and swelling (Patient not taking: Reported on 01/16/2015), Disp: 90 tablet, Rfl: 1 .  methocarbamol (  ROBAXIN) 500 MG tablet, Take 1 tablet (500 mg total) by mouth 4 (four) times daily. (Patient not taking: Reported on 01/16/2015), Disp: 80 tablet, Rfl: 1 .  progesterone (PROMETRIUM) 200 MG capsule, Take 200 mg by mouth daily. Reported on 05/27/2015, Disp: , Rfl:   Filed Vitals:   05/27/15 1606  BP: 117/75  Pulse: 69  Temp: 97.6 F (36.4 C)  Resp: 14  Height: 5' 2.5" (1.588 m)  Weight: 263 lb (119.296 kg)  SpO2: 100%    Body mass index is 47.31 kg/(m^2).        Physical exam she does have a large varicosities on both legs in her thighs and calves.  No skin changes of ulceration around her ankles.   I did  reimage her veins with SonoSite ultrasound compared this to her formal venous duplex. She does have enlarged great saphenous veins bilaterally with reflux directly into these large varicosities    Impression and plan failed conservative treatment of bilateral venous hypertension. Have recommended bilateral staged layers were ablation of her great saphenous vein and stab phlebectomy of tributary varicosities bilaterally. I would require 10-20 stab phlebectomy bilaterally. She understands this as an outpatient procedure under local anesthesia. She wished to proceed as soon as possible. We will initial do initial treatment in her right leg which is most severe and subsequent treatment of her left leg. Does understand the very slight risk of DVT associated with the procedure

## 2015-06-10 ENCOUNTER — Other Ambulatory Visit: Payer: Self-pay | Admitting: *Deleted

## 2015-06-10 DIAGNOSIS — I83811 Varicose veins of right lower extremities with pain: Secondary | ICD-10-CM

## 2015-06-12 ENCOUNTER — Other Ambulatory Visit: Payer: Self-pay | Admitting: *Deleted

## 2015-06-12 DIAGNOSIS — I83813 Varicose veins of bilateral lower extremities with pain: Secondary | ICD-10-CM

## 2015-06-27 ENCOUNTER — Encounter: Payer: Self-pay | Admitting: Vascular Surgery

## 2015-07-03 ENCOUNTER — Encounter: Payer: Self-pay | Admitting: Vascular Surgery

## 2015-07-03 ENCOUNTER — Ambulatory Visit (INDEPENDENT_AMBULATORY_CARE_PROVIDER_SITE_OTHER): Payer: BC Managed Care – PPO | Admitting: Vascular Surgery

## 2015-07-03 ENCOUNTER — Telehealth: Payer: Self-pay | Admitting: *Deleted

## 2015-07-03 VITALS — BP 127/82 | HR 65 | Temp 97.0°F | Resp 18 | Ht 62.5 in | Wt 258.0 lb

## 2015-07-03 DIAGNOSIS — I83813 Varicose veins of bilateral lower extremities with pain: Secondary | ICD-10-CM

## 2015-07-03 HISTORY — DX: Varicose veins of bilateral lower extremities with pain: I83.813

## 2015-07-03 HISTORY — PX: ENDOVENOUS ABLATION SAPHENOUS VEIN W/ LASER: SUR449

## 2015-07-03 NOTE — Progress Notes (Signed)
     Laser Ablation Procedure    Date: 07/03/2015   Shanae Luo DOB:07/11/1960  Consent signed: Yes    Surgeon:  Dr. Tawanna Cooler Jesica Goheen  Procedure: Laser Ablation: right Greater Saphenous Vein  BP 127/82 mmHg  Pulse 65  Temp(Src) 97 F (36.1 C) (Oral)  Resp 18  Ht 5' 2.5" (1.588 m)  Wt 258 lb (117.028 kg)  BMI 46.41 kg/m2  SpO2 100%  Tumescent Anesthesia: 950 cc 0.9% NaCl with 50 cc Lidocaine HCL with 1% Epi and 15 cc 8.4% NaHCO3  Local Anesthesia: 5 cc Lidocaine HCL and NaHCO3 (ratio 2:1)  15 watts continuous mode        Total energy: 2715   Total time: 3:01    Stab Phlebectomy: 10-20 Sites: Thigh and Calf  Right leg  Patient tolerated procedure well    Description of Procedure:  After marking the course of the secondary varicosities, the patient was placed on the operating table in the supine position, and the right leg was prepped and draped in sterile fashion.   Local anesthetic was administered and under ultrasound guidance the saphenous vein was accessed with a micro needle and guide wire; then the mirco puncture sheath was placed.  A guide wire was inserted saphenofemoral junction , followed by a 5 french sheath.  The position of the sheath and then the laser fiber below the junction was confirmed using the ultrasound.  Tumescent anesthesia was administered along the course of the saphenous vein using ultrasound guidance. The patient was placed in Trendelenburg position and protective laser glasses were placed on patient and staff, and the laser was fired at 15 watts continuous mode advancing 1-29mm/second for a total of 2715  joules.   For stab phlebectomies, local anesthetic was administered at the previously marked varicosities, and tumescent anesthesia was administered around the vessels.  Ten to 20 stab wounds were made using the tip of an 11 blade. And using the vein hook, the phlebectomies were performed using a hemostat to avulse the varicosities.  Adequate  hemostasis was achieved.     Steri strips were applied to the stab wounds and ABD pads and thigh high compression stockings were applied.  Ace wrap bandages were applied over the phlebectomy sites and at the top of the saphenofemoral junction. Blood loss was less than 15 cc.  The patient ambulated out of the operating room having tolerated the procedure well.  Uneventful ablation of right great saphenous vein from proximal calf to just below the saphenofemoral junction. Stab phlebectomy of multiple tributary throughout her thigh and calf with no immediate complications

## 2015-07-03 NOTE — Telephone Encounter (Signed)
Checking in with Angela Coffey to assess compression dressing and bleeding/oozing from stab phlebectomy sites right leg.  Angela Coffey states thigh high compression hose were too tight and continued to roll down her right thigh.  She states she removed the compression hose and applied sterile pads over phlebectomy sites and right inner thigh and then rewrapped snugly with Ace wrap.  States she had minimal bleeding/oozing.  Encouraged her to go to [employer] (she lives in [place]) tomorrow and purchase pantyhose style compression hose 20-30 mm HG.  Explained the importance of wearing compression hose post laser ablation.  Encouraged her to elevate right leg when sitting and to take Ibuprofen 600 mg three times daily with meals.  Will follow up with her tomorrow by telephone.

## 2015-07-04 ENCOUNTER — Telehealth: Payer: Self-pay | Admitting: *Deleted

## 2015-07-04 NOTE — Telephone Encounter (Signed)
    07/04/2015  Time: 10:00 AM   Patient Name: Angela Coffey  Patient of: T.F. Early  Procedure:Laser Ablation and stab phlebectomy 10-20 incisions  right  07-03-2015  Reached patient at home and checked  Her status  Yes    Comments/Actions Taken: [name] states she has no right leg pain or swelling.  She states she has found her 2X thigh high compression hose and is now wearing them and she plans on re-measuring herself and ordering more thigh high compression hose.  She states she experienced some bleeding and oozing from right leg phlebectomy sites yesterday but that has resolved.  She states she has one phlebectomy site on right inner thigh above her knee that has oozed this morning but the oozing has stopped with compression.   Reviewed all post procedural instructions with her and reminded her of post laser ablation duplex and VV follow up appointment with Dr. Arbie Cookey on 07-10-2015.      @SIGNATURE @

## 2015-07-10 ENCOUNTER — Ambulatory Visit (INDEPENDENT_AMBULATORY_CARE_PROVIDER_SITE_OTHER): Payer: Self-pay | Admitting: Vascular Surgery

## 2015-07-10 ENCOUNTER — Encounter: Payer: Self-pay | Admitting: Vascular Surgery

## 2015-07-10 ENCOUNTER — Ambulatory Visit (HOSPITAL_COMMUNITY)
Admission: RE | Admit: 2015-07-10 | Discharge: 2015-07-10 | Disposition: A | Discharge status: Home / Self Care | Payer: BC Managed Care – PPO | Source: Ambulatory Visit | Attending: Vascular Surgery | Admitting: Vascular Surgery

## 2015-07-10 VITALS — BP 125/74 | HR 70 | Temp 97.0°F | Resp 16 | Ht 62.5 in | Wt 258.0 lb

## 2015-07-10 DIAGNOSIS — I10 Essential (primary) hypertension: Secondary | ICD-10-CM | POA: Insufficient documentation

## 2015-07-10 DIAGNOSIS — I83811 Varicose veins of right lower extremities with pain: Secondary | ICD-10-CM | POA: Diagnosis not present

## 2015-07-10 DIAGNOSIS — Z9889 Other specified postprocedural states: Secondary | ICD-10-CM | POA: Insufficient documentation

## 2015-07-10 DIAGNOSIS — I83813 Varicose veins of bilateral lower extremities with pain: Secondary | ICD-10-CM

## 2015-07-10 NOTE — Progress Notes (Signed)
Here today for follow-up of right great saphenous vein ablation from proximal calf to saphenofemoral junction and stab phlebectomy of multiple tributary varicosities throughout her thigh medial calf and onto her distal calf. She does have some difficulty with compression due to her large size. She has warn knee-high compression and Ace wrap above.  Physical exam she does have more than the usual amount of bruising. No evidence of infection. Does have some palpable blood in the phlebectomy tunnels most particularly in her upper thigh.  Duplex today reveals no evidence of DVT. Her great saphenous vein was closed from the proximal calf to 1.9 cm from the saphenofemoral junction  Impression and plan: Excellent Jewett Mcgann result from ablation and stab phlebectomy 1 week ago. We will see her again as planned on 08/28/2015 for similar treatment to her left leg.

## 2015-08-09 IMAGING — CR DG CHEST 2V
2 series · 2 of 2 positions shown · non-contrast
Comparison: None.

CLINICAL DATA: Pre operative respiratory exam. Osteoarthritis of
the knee.

EXAM:
CHEST  2 VIEW

[w chest pa]
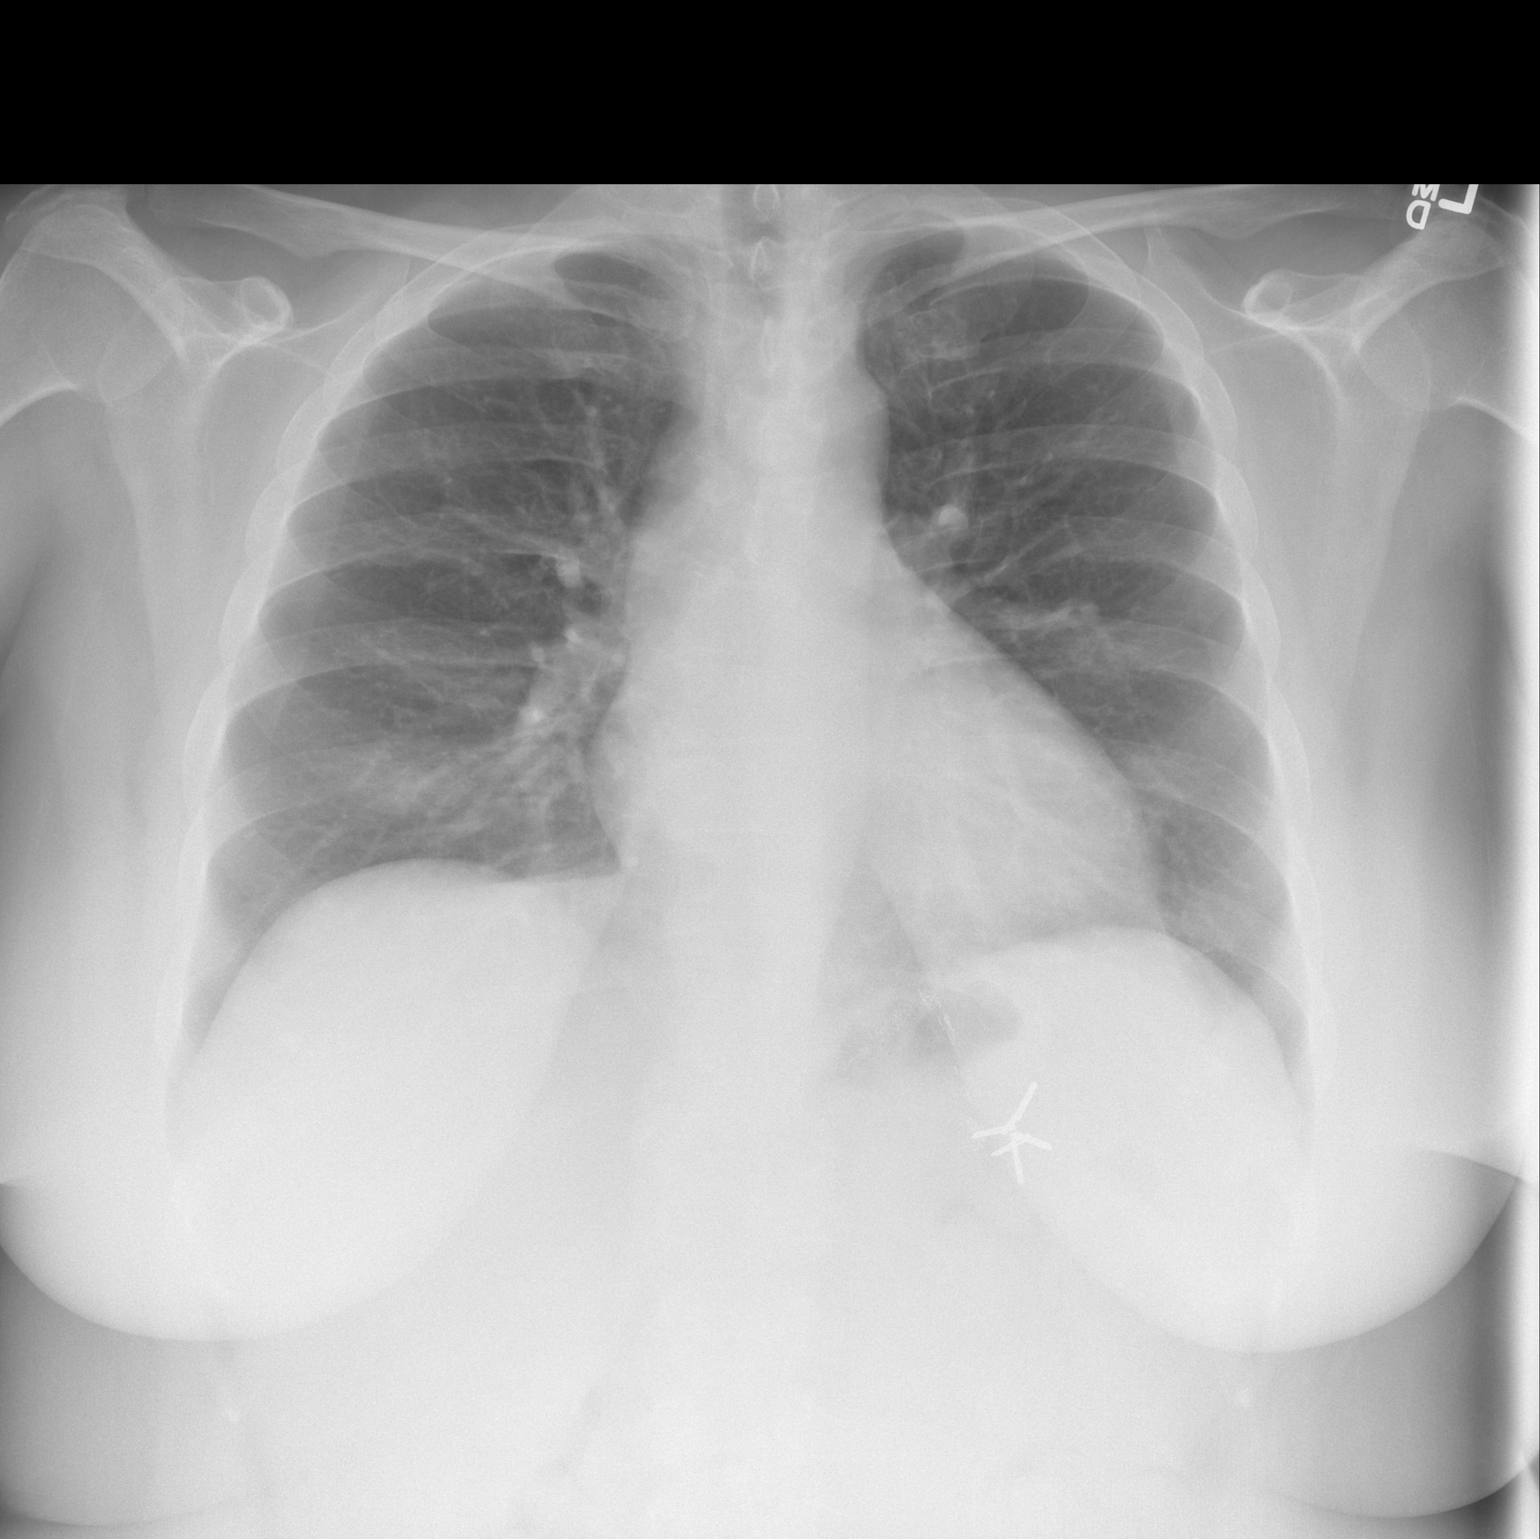

[w chest lat *]
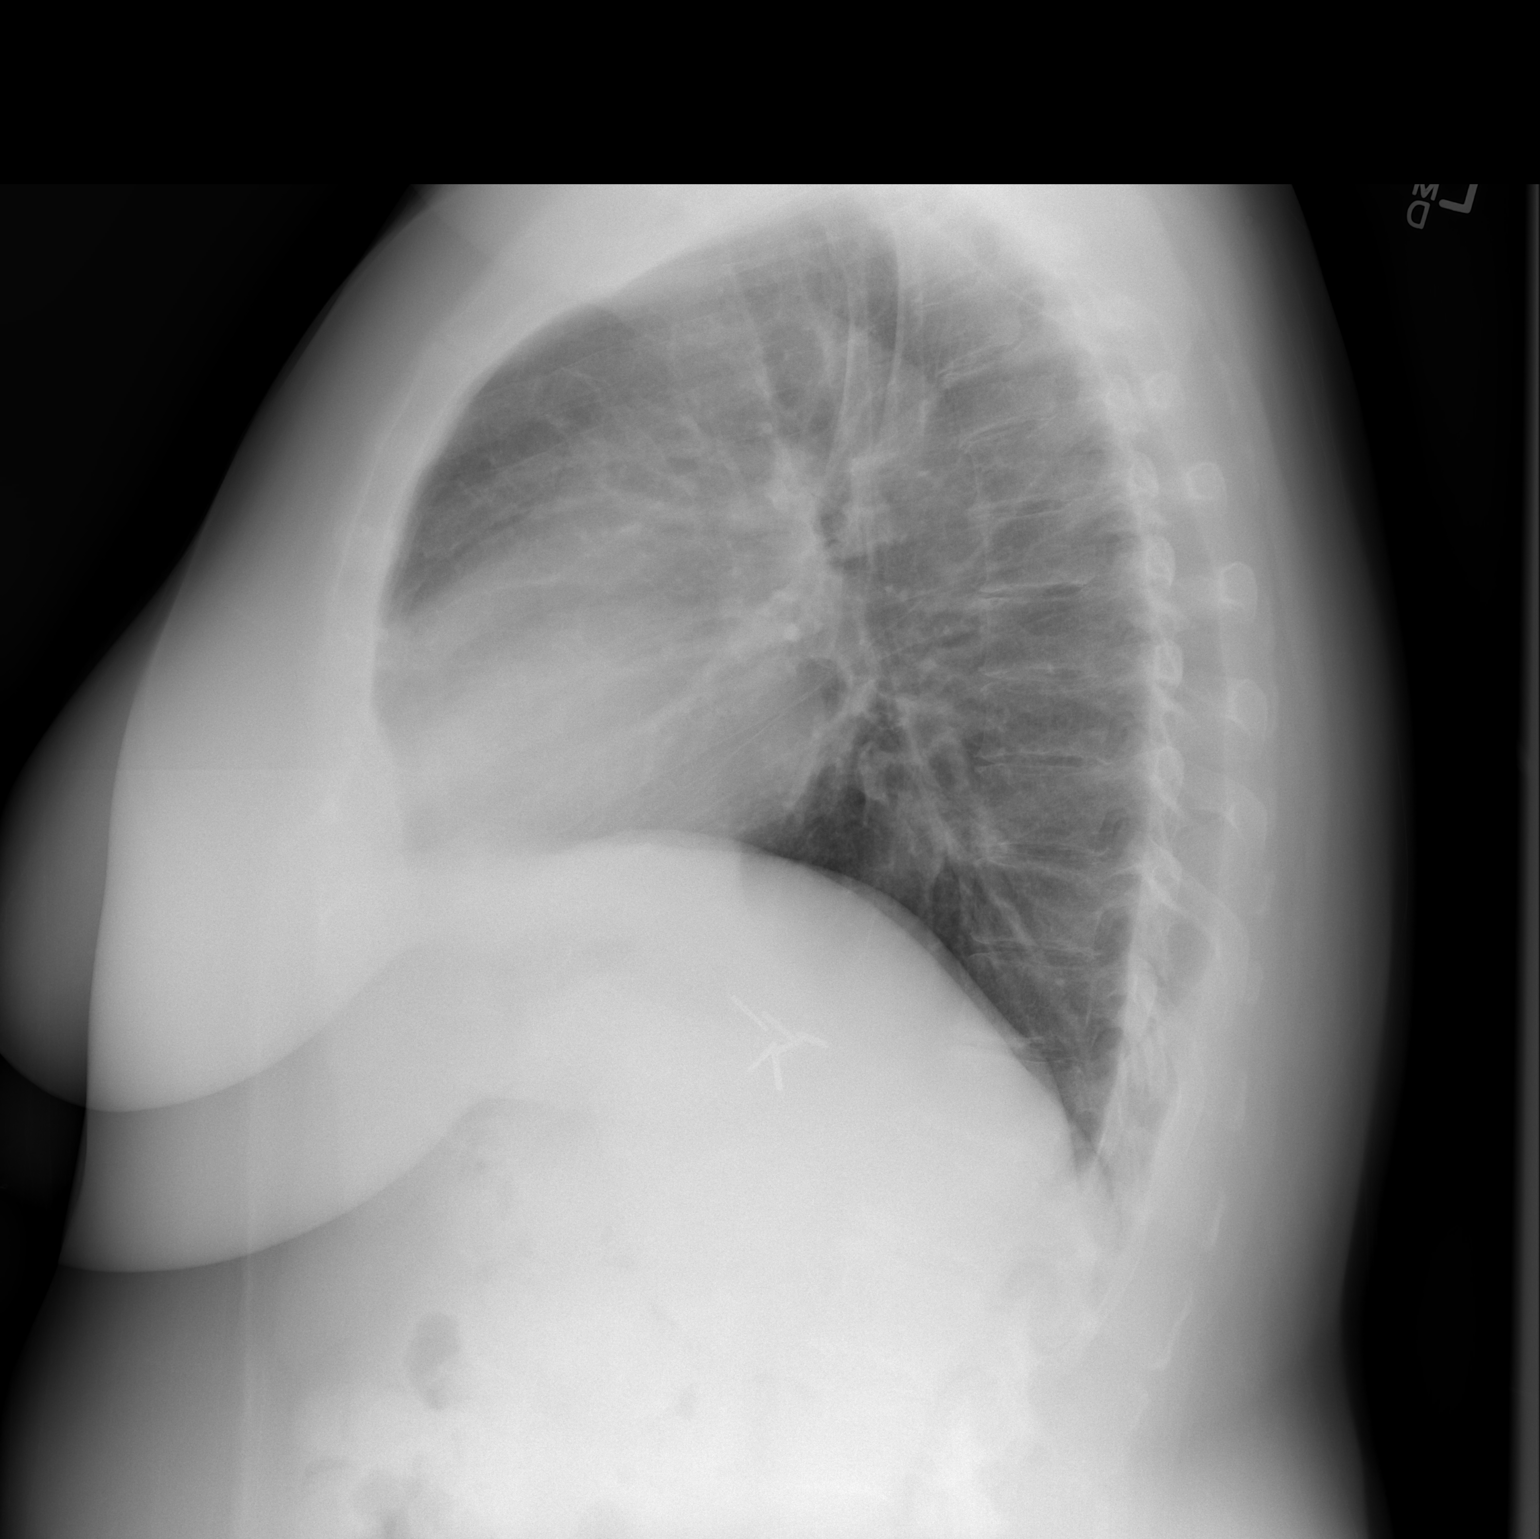

[2 of 2 positions shown; findings below may reference images not displayed]

FINDINGS: The heart size and mediastinal contours are within normal limits.
Both lungs are clear. The visualized skeletal structures are
unremarkable.
IMPRESSION: Normal chest.

## 2015-08-20 ENCOUNTER — Encounter: Payer: Self-pay | Admitting: Vascular Surgery

## 2015-08-27 ENCOUNTER — Encounter: Payer: Self-pay | Admitting: Vascular Surgery

## 2015-08-28 ENCOUNTER — Encounter: Payer: Self-pay | Admitting: Vascular Surgery

## 2015-08-28 ENCOUNTER — Ambulatory Visit (INDEPENDENT_AMBULATORY_CARE_PROVIDER_SITE_OTHER): Payer: BC Managed Care – PPO | Admitting: Vascular Surgery

## 2015-08-28 VITALS — BP 130/87 | HR 61 | Temp 97.1°F | Resp 18 | Ht 62.5 in | Wt 258.0 lb

## 2015-08-28 DIAGNOSIS — I83813 Varicose veins of bilateral lower extremities with pain: Secondary | ICD-10-CM | POA: Diagnosis not present

## 2015-08-28 HISTORY — PX: ENDOVENOUS ABLATION SAPHENOUS VEIN W/ LASER: SUR449

## 2015-08-28 NOTE — Progress Notes (Signed)
     Laser Ablation Procedure    Date: 08/28/2015   Angela Coffey DOB:February 23, 1961  Consent signed: Yes    Surgeon:  Dr. Tawanna Cooler Early  Procedure: Laser Ablation: left Greater Saphenous Vein  BP 130/87 mmHg  Pulse 61  Temp(Src) 97.1 F (36.2 C) (Oral)  Resp 18  Ht 5' 2.5" (1.588 m)  Wt 258 lb (117.028 kg)  BMI 46.41 kg/m2  SpO2 99%  Tumescent Anesthesia: 750 cc 0.9% NaCl with 50 cc Lidocaine HCL with 1% Epi and 15 cc 8.4% NaHCO3  Local Anesthesia: 7 cc Lidocaine HCL and NaHCO3 (ratio 2:1)  15 watts continuous mode        Total energy: 2673 Joules   Total time: 2:58    Stab Phlebectomy: 10-20 Sites: Thigh and Calf (left leg)  Patient tolerated procedure well    Description of Procedure:  After marking the course of the secondary varicosities, the patient was placed on the operating table in the supine position, and the left leg was prepped and draped in sterile fashion.   Local anesthetic was administered and under ultrasound guidance the saphenous vein was accessed with a micro needle and guide wire; then the mirco puncture sheath was placed.  A guide wire was inserted saphenofemoral junction , followed by a 5 french sheath.  The position of the sheath and then the laser fiber below the junction was confirmed using the ultrasound.  Tumescent anesthesia was administered along the course of the saphenous vein using ultrasound guidance. The patient was placed in Trendelenburg position and protective laser glasses were placed on patient and staff, and the laser was fired at 15 watts continuous mode advancing 1-73mm/second for a total of 2673 joules.   For stab phlebectomies, local anesthetic was administered at the previously marked varicosities, and tumescent anesthesia was administered around the vessels.  Ten to 20 stab wounds were made using the tip of an 11 blade. And using the vein hook, the phlebectomies were performed using a hemostat to avulse the varicosities.  Adequate  hemostasis was achieved.     Steri strips were applied to the stab wounds and ABD pads and thigh high compression stockings were applied.  Ace wrap bandages were applied over the phlebectomy sites and at the top of the saphenofemoral junction. Blood loss was less than 15 cc.  The patient ambulated out of the operating room having tolerated the procedure well.  Uneventful ablation of her left great saphenous vein from mid calf to just below her saphenofemoral junction. Stab phlebectomy of large varicosities in her calf and smaller varicosities on her anterior lateral thigh

## 2015-08-29 ENCOUNTER — Telehealth: Payer: Self-pay | Admitting: *Deleted

## 2015-08-29 NOTE — Telephone Encounter (Signed)
    08/29/2015  Time: 9:58 AM   Patient Name: Angela Coffey  Patient of: T.F. Early  Procedure:Laser Ablation and stab phlebectomy 10-20 incisions (left leg) left 08-28-2015    Reached patient at home and checked  Her status  Yes    Comments/Actions Taken: [name] states no left leg pain and no bleeding or oozing from stab phlebectomy sites (left leg). [name] states she slept "very well" last night.  Reviewed all post procedural instructions with her and reminded her of post laser ablation duplex and VV follow up with Dr. Arbie Cookey on 09-04-2015.      @

## 2015-09-01 ENCOUNTER — Encounter: Payer: Self-pay | Admitting: Vascular Surgery

## 2015-09-04 ENCOUNTER — Encounter: Payer: Self-pay | Admitting: Vascular Surgery

## 2015-09-04 ENCOUNTER — Ambulatory Visit (HOSPITAL_COMMUNITY)
Admission: RE | Admit: 2015-09-04 | Discharge: 2015-09-04 | Disposition: A | Discharge status: Home / Self Care | Payer: BC Managed Care – PPO | Source: Ambulatory Visit | Attending: Vascular Surgery | Admitting: Vascular Surgery

## 2015-09-04 ENCOUNTER — Ambulatory Visit (INDEPENDENT_AMBULATORY_CARE_PROVIDER_SITE_OTHER): Payer: Self-pay | Admitting: Vascular Surgery

## 2015-09-04 VITALS — BP 120/81 | HR 88 | Temp 97.6°F | Resp 16

## 2015-09-04 DIAGNOSIS — Z9889 Other specified postprocedural states: Secondary | ICD-10-CM | POA: Diagnosis present

## 2015-09-04 DIAGNOSIS — I10 Essential (primary) hypertension: Secondary | ICD-10-CM | POA: Diagnosis not present

## 2015-09-04 DIAGNOSIS — I83813 Varicose veins of bilateral lower extremities with pain: Secondary | ICD-10-CM | POA: Diagnosis not present

## 2015-09-04 DIAGNOSIS — K219 Gastro-esophageal reflux disease without esophagitis: Secondary | ICD-10-CM | POA: Insufficient documentation

## 2015-09-04 NOTE — Progress Notes (Signed)
Patient name: Angela Coffey MRN: [id number] DOB: 1961-03-11 Sex: female  REASON FOR VISIT: Follow-up left great saphenous vein ablation and stab phlebectomy  HPI: Angela Coffey is a 55 y.o. female here today for follow-up of ablation of left great saphenous vein one week ago. This was from calf to just below the saphenofemoral junction. She also had stab phlebectomy of tributary is in her thigh and calf. She had similar treatment several weeks prior in her right leg. She has done quite well with the no complications. She reports minimal discomfort  Current Outpatient Prescriptions  Medication Sig Dispense Refill  . acetaminophen (TYLENOL) 500 MG tablet Take 500 mg by mouth every 6 (six) hours as needed. Patient takes 2 tablets when she takes Rebif    . B Complex-C (B-COMPLEX WITH VITAMIN C) tablet Take 1 tablet by mouth daily. Reported on 07/10/2015    . baclofen (LIORESAL) 10 MG tablet Take 10 mg by mouth at bedtime as needed for muscle spasms.    Marland Kitchen buPROPion (WELLBUTRIN XL) 150 MG 24 hr tablet Take 300 mg by mouth daily. Patient takes in the am    . celecoxib (CELEBREX) 200 MG capsule Take 200 mg by mouth. Reported on 07/10/2015    . cholecalciferol (VITAMIN D) 1000 UNITS tablet Take 2,000 Units by mouth daily.    Marland Kitchen escitalopram (LEXAPRO) 20 MG tablet Take 20 mg by mouth daily. Patient takes at nite    . esomeprazole (NEXIUM) 20 MG capsule Take 20 mg by mouth 2 (two) times daily.    Marland Kitchen estradiol (ESTRACE) 0.5 MG tablet Take 0.5 mg by mouth daily. Reported on 07/10/2015    . Ibuprofen (MOTRIN PO) Take by mouth. Patient takes 200mg  as needed    . interferon beta-1a (REBIF) 44 MCG/0.5ML injection Inject 44 mcg into the skin 3 (three) times a week. Patient does on Sunday, Tuesday, and Thursday at [place]    . levothyroxine (SYNTHROID, LEVOTHROID) 200 MCG tablet Take 200 mcg by mouth daily before breakfast.    . levothyroxine (SYNTHROID, LEVOTHROID) 25 MCG tablet Take 25 mcg by mouth daily before  breakfast.    . meloxicam (MOBIC) 7.5 MG tablet Take 2 tablets (15 mg total) by mouth daily. Take daily for first 7-10 days then as needed for pain and swelling 90 tablet 1  . methocarbamol (ROBAXIN) 500 MG tablet Take 1 tablet (500 mg total) by mouth 4 (four) times daily. 80 tablet 1  . nebivolol (BYSTOLIC) 5 MG tablet Take 5 mg by mouth daily. Patient takes at nite    . PRESCRIPTION MEDICATION Reported on 05/27/2015    . progesterone (PROMETRIUM) 200 MG capsule Take 200 mg by mouth daily. Reported on 07/10/2015    . aspirin EC 325 MG tablet Take 1 tablet (325 mg total) by mouth daily. (Patient not taking: Reported on 07/10/2015) 30 tablet 0  . azithromycin (ZITHROMAX Z-PAK) 250 MG tablet Take by mouth daily. Reported on 09/04/2015    . HYDROcodone-acetaminophen (NORCO) 5-325 MG per tablet Take 1-2 tablets by mouth every 6 (six) hours as needed. (Patient not taking: Reported on 05/27/2015) 90 tablet 0   No current facility-administered medications for this visit.     PHYSICAL EXAM: Filed Vitals:   09/04/15 1002  BP: 120/81  Pulse: 88  Temp: 97.6 F (36.4 C)  Resp: 16    GENERAL: The patient is a well-nourished female, in no acute distress. The vital signs are documented above. . Mild bruising over the phlebectomy sites. Does have  some superficial blisters on her medial distal thigh has a irritation from the compression.  Duplex today reveals closure from the distal insertion site to 2 cm below the saphenofemoral junction and no DVT  MEDICAL ISSUES: Stable overall. Continue her compression for one additional weeks and then as needed. Will see [place] again on an as-needed basis  Angela Coffey [facility] [employer]: [phone]

## 2016-02-28 ENCOUNTER — Encounter: Payer: Self-pay | Admitting: Neurology

## 2016-05-05 ENCOUNTER — Ambulatory Visit (INDEPENDENT_AMBULATORY_CARE_PROVIDER_SITE_OTHER): Payer: BC Managed Care – PPO | Admitting: Neurology

## 2016-05-05 ENCOUNTER — Encounter: Payer: Self-pay | Admitting: Neurology

## 2016-05-05 VITALS — BP 133/88 | HR 61 | Resp 18 | Ht 62.5 in | Wt 253.0 lb

## 2016-05-05 DIAGNOSIS — Z79899 Other long term (current) drug therapy: Secondary | ICD-10-CM

## 2016-05-05 DIAGNOSIS — R5383 Other fatigue: Secondary | ICD-10-CM | POA: Diagnosis not present

## 2016-05-05 DIAGNOSIS — R269 Unspecified abnormalities of gait and mobility: Secondary | ICD-10-CM

## 2016-05-05 DIAGNOSIS — R4789 Other speech disturbances: Secondary | ICD-10-CM

## 2016-05-05 DIAGNOSIS — G35A Relapsing-remitting multiple sclerosis: Secondary | ICD-10-CM | POA: Insufficient documentation

## 2016-05-05 DIAGNOSIS — G35 Multiple sclerosis: Secondary | ICD-10-CM | POA: Diagnosis not present

## 2016-05-05 HISTORY — DX: Unspecified abnormalities of gait and mobility: R26.9

## 2016-05-05 HISTORY — DX: Other fatigue: R53.83

## 2016-05-05 HISTORY — DX: Other long term (current) drug therapy: Z79.899

## 2016-05-05 HISTORY — DX: Other speech disturbances: R47.89

## 2016-05-05 MED ORDER — ARMODAFINIL 200 MG PO TABS: 200.0000 mg | ORAL_TABLET | Freq: Every day | ORAL | 5 refills | Status: DC

## 2016-05-05 NOTE — Progress Notes (Signed)
[facility]  PATIENT: Angela Coffey DOB: 08-14-1960  REFERRING DOCTOR OR PCP:  Philemon Kingdomaroline Prochnau (PCP); Quinn PlowmanAmy Kearns (Neurology) SOURCE: patient, notes from PCP and [facility]; MRI images on PACS, Lab results  _________________________________   HISTORICAL  CHIEF COMPLAINT:  Chief Complaint  Patient presents with  . New Patient (Initial Visit)    Rm 12. Patient is currently on Rebif. She would like to discuss medication change. Previous patient of Dr. Adella HareApplegate.     HISTORY OF PRESENT ILLNESS:  I [facility] you [facility] neurologic consultation regarding her multiple sclerosis.  MS history:   In 2008, she had the onset of numbness in the left arm. When symptoms persisted for a few weeks she was referred to Dr. Adella HareApplegate. Nerve conduction and EMG study was normal so an MRI of the cervical spine was ordered which was abnormal showing foci consistent with MS. An MRI of the brain showed classic MS lesions and she was diagnosed with MS. She was started on Rebif. She had some injection reactions her first year but generally has tolerated it well.  A couple years ago, she saw Dr. Leona CarryJill Conway in [place] and Ashok Cordiaubagio was tried. However, she did not feel good when she took Aubagio and went back on Rebif. She has had a couple of sensory exacerbations and received some IV steroids.   Earlier in 2017, she had phasic spasms on the right 1 day and not return.      MS DMT:   She continues on Rebif 3 times a week. She premedication with Tylenol and takes Motrin afterwards.   She would like to consider ocrelizumab due to injection fatigue.  I personally reviewed the MRI of the brain and cervical spine from 03/03/2016 and the MRI of the brain from 07/10/2014. The MRI of the brain shows classic periventricular, juxtacortical and deep white matter foci in a pattern and configuration consistent with MS. There are no  new lesions and no changes compared to the 2016 MRI. The MRI of the cervical spine showed 3 within the spinal cord, midline and just left of midline at C2-C3, left posteriorly at C4-C5 and right posteriorly at C6-C7. None of the foci appeared to be acute.  Gait/strength/sensation:    She staggers some when she is more tired and has had a couple falls, none this year.   She sometimes hits the door jamb on her her way through.    She avoids ladders and holds on to the rails for stairs.  Climbing onto a bus is difficult   She notes mild weakness in her legs when climbing up a astep (uses her arms to help).     She has mild spasticity in her legs and is helped by baclofen when necessary. She had one episode earlier this year lasting for hours where she would have repeated episodes of right sided basic spasms in the arm and face more than the leg.    She has not had this repeat.  Vision:   She denies any MS related problem with her vision.   She has no history of optic neuritis or diplopia.  Bladder/bowel:  She denies any significant problem with bladder or bowel function.  Fatigue/sleep: She reports fatigue that is helped by Nuvigil 200 mg daily.   She generally sleeps well at night.  She takes Vit D 50,000 weekly.  Her maternal grandfather had MS and her mother had SLE.  Mood/cognition:    She she notes some depression and anxiety. This is helped by appropriate and escitalopram. Noted mild cognitive issues. Specifically, she has some difficulty with word finding and attention and focus. She sometimes has trouble putting names and faces together.  REVIEW OF SYSTEMS: Constitutional: No fevers, chills, sweats, or change in appetite.   Notes fatigue Eyes: No visual changes, double vision, eye pain Ear, nose and throat: No hearing loss, ear pain, nasal congestion, sore throat.  Has vertigo.    Cardiovascular: No chest pain, palpitations Respiratory: No shortness of breath at rest or with exertion.    No wheezes GastrointestinaI: No nausea, vomiting, diarrhea, abdominal pain, fecal incontinence Genitourinary: No dysuria, urinary retention or frequency.  No nocturia. Musculoskeletal: Notes neck pain.  No back pain Integumentary: No rash, pruritus, skin lesions Neurological: as above Psychiatric: Notes depression and anxiety Endocrine: No palpitations, diaphoresis, change in appetite, change in weigh or increased thirst Hematologic/Lymphatic: No anemia, purpura, petechiae. Allergic/Immunologic: No itchy/runny eyes, nasal congestion, recent allergic reactions, rashes  ALLERGIES: No Known Allergies  HOME MEDICATIONS:  Current Outpatient Prescriptions:  .  acetaminophen (TYLENOL) 500 MG tablet, Take 500 mg by mouth every 6 (six) hours as needed. Patient takes 2 tablets when she takes Rebif, Disp: , Rfl:  .  B Complex-C (B-COMPLEX WITH VITAMIN C) tablet, Take 1 tablet by mouth daily. Reported on 07/10/2015, Disp: , Rfl:  .  baclofen (LIORESAL) 10 MG tablet, Take 10 mg by mouth at bedtime as needed for muscle spasms., Disp: , Rfl:  .  buPROPion (WELLBUTRIN XL) 150 MG 24 hr tablet, Take 300 mg by mouth daily. Patient takes in the am, Disp: , Rfl:  .  celecoxib (CELEBREX) 200 MG capsule, Take 200 mg by mouth. Reported on 07/10/2015, Disp: , Rfl:  .  cholecalciferol (VITAMIN D) 1000 UNITS tablet, Take 2,000 Units by mouth daily., Disp: , Rfl:  .  escitalopram (LEXAPRO) 20 MG tablet, Take 20 mg by mouth daily. Patient takes at nite, Disp: , Rfl:  .  esomeprazole (NEXIUM) 20 MG capsule, Take 20 mg by mouth 2 (two) times daily., Disp: , Rfl:  .  Ibuprofen (MOTRIN PO), Take by mouth. Patient takes 200mg  as needed, Disp: , Rfl:  .  interferon beta-1a (REBIF) 44 MCG/0.5ML injection, Inject 44 mcg into the skin 3 (three) times a week. Patient does on Sunday, Tuesday, and Thursday at nite, Disp: , Rfl:  .  levothyroxine (SYNTHROID, LEVOTHROID) 200 MCG tablet, Take 200 mcg by mouth daily before breakfast.,  Disp: , Rfl:  .  levothyroxine (SYNTHROID, LEVOTHROID) 25 MCG tablet, Take 25 mcg by mouth daily before breakfast., Disp: , Rfl:  .  meloxicam (MOBIC) 7.5 MG tablet, Take 2 tablets (15 mg total) by mouth daily. Take daily for first 7-10 days then as needed for pain and swelling, Disp: 90 tablet, Rfl: 1 .  nebivolol (BYSTOLIC) 5 MG tablet, Take 5 mg by mouth daily. Patient takes at nite, Disp: , Rfl:  .  PRESCRIPTION MEDICATION, Reported on 05/27/2015, Disp: , Rfl:   PAST MEDICAL HISTORY: Past Medical History:  Diagnosis Date  . Anxiety   . Depression   . GERD (gastroesophageal reflux disease)   . Hypertension   . Hypothyroidism   . Multiple sclerosis (HCC)   . Varicose veins     PAST SURGICAL HISTORY: Past Surgical History:  Procedure Laterality Date  . CHOLECYSTECTOMY    . ENDOVENOUS ABLATION SAPHENOUS VEIN W/ LASER Right 07-03-2015   endovenous laser ablation  right greater saphenous vein and stab phlebectomy 10-20 incisions right leg by Gretta Began MD  . ENDOVENOUS ABLATION SAPHENOUS VEIN W/ LASER Left 08-28-2015     endovenous laser ablation left greater saphenous vein and stab phlebectomy 10-20 incisions left leg  by Gretta Began MD   . GASTRIC BYPASS    . KNEE ARTHROSCOPY Left 04/19/2013   Procedure: LEFT KNEE ARTHROSCOPY AND DEBRIDEMENT, PARTIAL MEDIAL AND LATERAL MENISCECTOMY AND MEDIAL PATELLOFEMORAL AND LATERAL CHONDROPLASTY ;  Surgeon: Shelda Pal, MD;  Location: [facility];  Service: Orthopedics;  Laterality: Left;  . TONSILLECTOMY  1971    FAMILY HISTORY: Family History  Problem Relation Age of Onset  . Lupus Mother   . Diabetes Mother   . Heart disease Mother   . Heart failure Mother   . Diabetes Father   . Heart disease Father   . COPD Father   . Colon cancer Maternal Grandfather     SOCIAL HISTORY:  Social History   Social History  . Marital status: Married    Spouse name: N/A  . Number of children: 0  . Years of education: BS   Occupational History    . [employer]    Social History Main Topics  . Smoking status: Never Smoker  . Smokeless tobacco: Never Used  . Alcohol use No  . Drug use: No  . Sexual activity: Not on file   Other Topics Concern  . Not on file   Social History Narrative   Drinks 1 cup of tea a day      PHYSICAL EXAM  Vitals:   05/05/16 1322  BP: 133/88  Pulse: 61  Resp: 18  Weight: 253 lb (114.8 kg)  Height: 5' 2.5" (1.588 m)    Body mass index is 45.54 kg/m.   General: The patient is well-developed and well-nourished and in no acute distress  Eyes:  Funduscopic exam shows normal optic discs and retinal vessels.  Neck: The neck is supple, no carotid bruits are noted.  The neck is nontender.  Cardiovascular: The heart has a regular rate and rhythm with a normal S1 and S2. There were no murmurs, gallops or rubs. Lungs are clear to auscultation.  Skin: Extremities are without significant edema.  Musculoskeletal:  Back is nontender  Neurologic Exam  Mental status: The patient is alert and oriented x 3 at the time of the examination. The patient has apparent normal recent and remote memory, with an apparently normal attention span and concentration ability.   Speech is normal.  Cranial nerves: Extraocular movements are full. Pupils are equal, round, and reactive to light and accomodation.  Visual fields are full.  Facial symmetry is present. There is good facial sensation to soft touch bilaterally.Facial strength is normal.  Trapezius and sternocleidomastoid strength is normal. No dysarthria is noted.  The tongue is midline, and the patient has symmetric elevation of the soft palate. No obvious hearing deficits are noted.  Motor:  Muscle bulk is normal.   Tone is normal. Strength is  5 / 5 in all 4 extremities.   Sensory: Sensory testing is intact to pinprick, soft touch and vibration sensation in all 4 extremities.  Coordination: Cerebellar testing reveals good finger-nose-finger and  heel-to-shin bilaterally.  Gait and station: Station is normal.   Gait is normal. Tandem gait is poor and she needs suport. Romberg is borderline.   Reflexes: Deep tendon reflexes are symmetric and increased bilaterally though she does not have clonus.   Plantar  responses are flexor.     DIAGNOSTIC DATA (LABS, IMAGING, TESTING) - I reviewed patient records, labs, notes, testing and imaging myself where available.     ASSESSMENT AND PLAN  Multiple sclerosis (HCC) - Plan: CBC with Differential/Platelet, Hepatitis B surface antigen, Hepatitis B core antibody, total, Hepatitis B surface antibody, Quantiferon tb gold assay (blood), Hepatic function panel  High risk medication use - Plan: CBC with Differential/Platelet, Hepatitis B surface antigen, Hepatitis B core antibody, total, Hepatitis B surface antibody, Quantiferon tb gold assay (blood), Hepatic function panel  Gait disturbance  Other fatigue  Verbal fluency disorder   In summary, Mrs. Trulson is a 55 year old woman with multiple sclerosis diagnosed 9 years ago. Due to needle fatigue, she would like to switch from Rebif to a non-injectable medication. We went over her options and ocrelizumab in more detail. She would like to switch and she signed a service request form. We will check her for hepatitis B and TB.   She understands that there were some women who had breast cancer in the study there is not a black box warning, and she will continue to get mammograms on a regular basis and follow up with any other recommendations from her PCP.   Spasms worsen AED.      She does have some difficulty with verbal fluency and decreased attention at times. If this worsens, we will consider switching from armodafinil to  one of the stimulants.  She will return in about a month for her initial ocrelizumab infusion and call us sooner if she has any new or worsening neurologic symptoms. I will see her back a regular visit in about 4  months.  Thank you for asking me to see Mrs. Sabino Gasser for a neurologic consultation regarding her MS. Please let me know but can be of further assistance with her or other patients in the future.   Richard A. Epimenio Foot, MD, PhD 05/05/2016, 1:34 PM Certified in Neurology, Clinical Neurophysiology, Sleep Medicine, Pain Medicine and Neuroimaging  [facility] [address] [place], [place] [address] [phone]

## 2016-05-06 LAB — CBC WITH DIFFERENTIAL/PLATELET
Basophils Absolute: 0 10*3/uL (ref 0.0–0.2)
Basos: 0 %
EOS (ABSOLUTE): 0 10*3/uL (ref 0.0–0.4)
Eos: 1 %
Hematocrit: 41.7 % (ref 34.0–46.6)
Hemoglobin: 14.2 g/dL (ref 11.1–15.9)
Immature Grans (Abs): 0 10*3/uL (ref 0.0–0.1)
Immature Granulocytes: 0 %
Lymphocytes Absolute: 1.8 10*3/uL (ref 0.7–3.1)
Lymphs: 30 %
MCH: 28.4 pg (ref 26.6–33.0)
MCHC: 34.1 g/dL (ref 31.5–35.7)
MCV: 83 fL (ref 79–97)
Monocytes Absolute: 0.5 10*3/uL (ref 0.1–0.9)
Monocytes: 9 %
Neutrophils Absolute: 3.6 10*3/uL (ref 1.4–7.0)
Neutrophils: 60 %
Platelets: 305 10*3/uL (ref 150–379)
RBC: 5 x10E6/uL (ref 3.77–5.28)
RDW: 14.3 % (ref 12.3–15.4)
WBC: 6 10*3/uL (ref 3.4–10.8)

## 2016-05-06 LAB — HEPATIC FUNCTION PANEL
ALT: 17 IU/L (ref 0–32)
AST: 14 IU/L (ref 0–40)
Albumin: 4.2 g/dL (ref 3.5–5.5)
Alkaline Phosphatase: 142 IU/L — ABNORMAL HIGH (ref 39–117)
Bilirubin Total: 0.3 mg/dL (ref 0.0–1.2)
Bilirubin, Direct: 0.1 mg/dL (ref 0.00–0.40)
Total Protein: 7.2 g/dL (ref 6.0–8.5)

## 2016-05-06 LAB — HEPATITIS B SURFACE ANTIBODY,QUALITATIVE: Hep B Surface Ab, Qual: REACTIVE

## 2016-05-06 LAB — HEPATITIS B SURFACE ANTIGEN: Hepatitis B Surface Ag: NEGATIVE

## 2016-05-06 LAB — HEPATITIS B CORE ANTIBODY, TOTAL: Hep B Core Total Ab: NEGATIVE

## 2016-05-07 LAB — QUANTIFERON TB GOLD ASSAY (BLOOD)

## 2016-05-07 LAB — QUANTIFERON IN TUBE
QFT TB AG MINUS NIL VALUE: 0 IU/mL
QUANTIFERON MITOGEN VALUE: 5.03 IU/mL
QUANTIFERON TB AG VALUE: 0.03 IU/mL
QUANTIFERON TB GOLD: NEGATIVE
Quantiferon Nil Value: 0.03 IU/mL

## 2016-05-17 ENCOUNTER — Telehealth: Payer: Self-pay | Admitting: *Deleted

## 2016-05-17 NOTE — Telephone Encounter (Signed)
-----   Message from Asa Lente, MD sent at 05/12/2016  9:39 AM EST ----- Angela Coffey is fine. We can send in the ocrelizumab form

## 2016-05-17 NOTE — Telephone Encounter (Signed)
I spoke with Angela Coffey on 05-14-16 and advised labs are ok for Ocrelizumab.  I was out of the office on the day she was seen, and I am not able to locate an Ocrelizumab srf.  She will come in some time this week and sign one/fim

## 2016-05-20 ENCOUNTER — Encounter: Payer: Self-pay | Admitting: *Deleted

## 2016-05-31 ENCOUNTER — Telehealth: Payer: Self-pay | Admitting: Neurology

## 2016-05-31 NOTE — Telephone Encounter (Signed)
I have spoken with Rhyla this afternoon.   Ocrelizumab srf was turned in to [facility] on 05-20-16.  It is generally taking about 6 weeks to be set up for the first infusions.  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

## 2016-05-31 NOTE — Telephone Encounter (Signed)
Patient called to see about infusions and when she will be scheduled.  Please call

## 2016-06-16 ENCOUNTER — Telehealth: Payer: Self-pay | Admitting: *Deleted

## 2016-06-17 NOTE — Telephone Encounter (Signed)
Appt. r/s due to provider absence from office for a mtg/fim

## 2016-09-07 ENCOUNTER — Ambulatory Visit: Payer: BC Managed Care – PPO | Admitting: Neurology

## 2016-09-08 ENCOUNTER — Encounter: Payer: Self-pay | Admitting: Neurology

## 2016-09-08 ENCOUNTER — Ambulatory Visit (INDEPENDENT_AMBULATORY_CARE_PROVIDER_SITE_OTHER): Payer: BC Managed Care – PPO | Admitting: Neurology

## 2016-09-08 ENCOUNTER — Encounter (INDEPENDENT_AMBULATORY_CARE_PROVIDER_SITE_OTHER): Payer: Self-pay

## 2016-09-08 VITALS — BP 130/76 | HR 74 | Resp 18 | Ht 62.5 in | Wt 261.5 lb

## 2016-09-08 DIAGNOSIS — R5383 Other fatigue: Secondary | ICD-10-CM

## 2016-09-08 DIAGNOSIS — Z79899 Other long term (current) drug therapy: Secondary | ICD-10-CM | POA: Diagnosis not present

## 2016-09-08 DIAGNOSIS — R4789 Other speech disturbances: Secondary | ICD-10-CM | POA: Diagnosis not present

## 2016-09-08 DIAGNOSIS — G35 Multiple sclerosis: Secondary | ICD-10-CM | POA: Diagnosis not present

## 2016-09-08 DIAGNOSIS — R269 Unspecified abnormalities of gait and mobility: Secondary | ICD-10-CM

## 2016-09-08 DIAGNOSIS — G35D Multiple sclerosis, unspecified: Secondary | ICD-10-CM

## 2016-09-08 MED ORDER — ARMODAFINIL 200 MG PO TABS: 200.0000 mg | ORAL_TABLET | Freq: Every day | ORAL | 5 refills | Status: DC

## 2016-09-08 NOTE — Progress Notes (Signed)
[facility]  PATIENT: Angela Coffey DOB: 11-Jun-1960  REFERRING DOCTOR OR PCP:  Philemon Kingdom (PCP); Quinn Plowman (Neurology) SOURCE: patient, notes from PCP and [facility]; MRI images on PACS, Lab results  _________________________________   HISTORICAL  CHIEF COMPLAINT:  Chief Complaint  Patient presents with  . Multiple Sclerosis    Sts. she tolerated parts A and B of first Ocrelizumab infusion well.  Next infusion due in September 2018.  Believes balance may be some worse.  1 fall.  Participated in the MS walk at the [facility] last wk and sts. was able to walk the farthest she's ever been able to go./fim    HISTORY OF PRESENT ILLNESS:  Angela Coffey is a 56 yo woman with multiple sclerosis.  MS:   She had her first course of ocrelizumab in March 2018. She tolerated the infusions well and there were no complications. She had no infusion reactions at all. Since then, she feels stronger and less fatigue in general.\  Gait/strength/sensation:    She feels her gait is doing better since starting ocrelizumab.    She walked 20 minutes without stopping about a mile which is the most she has walked in several years.  She still staggers slightly though she feels this is mild.   She has one fall hitting her shoulder when she tried to get dressed standing up.   Leg spasticity is better.,   She is using less baclofen.     Vision:   She denies any MS related problem with her vision.   She has no history of optic neuritis or diplopia.  Bladder/bowel:  She denies any significant problem with bladder or bowel function.  Fatigue/sleep: She notes a little less fatigue on Ocrelizumab than Rebif.  She reports fatigue that is helped by Nuvigil 200 mg daily.   She generally sleeps well at night.  Mood/cognition:    She notes less depression and anxiety. She remains on escitalopram.  She notes mild cognition changes but these are better on ocrelizumab. She has noted some  difficulty with word finding and attention and focus.    She takes Vit D 50,000 weekly.  Her maternal grandfather had MS and her mother had SLE.    MS history:   In 2008, she had the onset of numbness in the left arm. When symptoms persisted for a few weeks she was referred to Dr. Adella Hare. Nerve conduction and EMG study was normal so an MRI of the cervical spine was ordered which was abnormal showing foci consistent with MS. An MRI of the brain showed classic MS lesions and she was diagnosed with MS. She was started on Rebif. She had some injection reactions her first year but generally has tolerated it well.  A couple years ago, she saw Dr. Leona Carry in [place] and Ashok Cordia was tried. However, she did not feel good when she took Aubagio and went back on Rebif. She has had a couple of sensory exacerbations and received some IV steroids.   She had her first course of ocrelizumab in March 2018.   I have reviewed the MRI of the brain and cervical spine from 03/03/2016 and the MRI of the brain from 07/10/2014. The MRI of the brain shows classic periventricular, juxtacortical and deep white matter foci in a pattern and configuration consistent with MS. There are no new lesions and no changes compared to the 2016 MRI. The MRI of the cervical spine showed 3 within the spinal cord, midline and  just left of midline at C2-C3, left posteriorly at C4-C5 and right posteriorly at C6-C7. None of the foci appeared to be acute.  REVIEW OF SYSTEMS: Constitutional: No fevers, chills, sweats, or change in appetite.   Notes fatigue Eyes: No visual changes, double vision, eye pain Ear, nose and throat: No hearing loss, ear pain, nasal congestion, sore throat.  Has vertigo.    Cardiovascular: No chest pain, palpitations Respiratory: No shortness of breath at rest or with exertion.   No wheezes GastrointestinaI: No nausea, vomiting, diarrhea, abdominal pain, fecal incontinence Genitourinary: No dysuria, urinary  retention or frequency.  No nocturia. Musculoskeletal: Notes neck pain.  No back pain Integumentary: No rash, pruritus, skin lesions Neurological: as above Psychiatric: Notes depression and anxiety Endocrine: No palpitations, diaphoresis, change in appetite, change in weigh or increased thirst Hematologic/Lymphatic: No anemia, purpura, petechiae. Allergic/Immunologic: No itchy/runny eyes, nasal congestion, recent allergic reactions, rashes  ALLERGIES: No Known Allergies  HOME MEDICATIONS:  Current Outpatient Prescriptions:  .  acetaminophen (TYLENOL) 500 MG tablet, Take 500 mg by mouth every 6 (six) hours as needed. Patient takes 2 tablets when she takes Rebif, Disp: , Rfl:  .  Armodafinil 200 MG TABS, Take 200 mg by mouth daily., Disp: 30 tablet, Rfl: 5 .  B Complex-C (B-COMPLEX WITH VITAMIN C) tablet, Take 1 tablet by mouth daily. Reported on 07/10/2015, Disp: , Rfl:  .  baclofen (LIORESAL) 10 MG tablet, Take 10 mg by mouth at bedtime as needed for muscle spasms., Disp: , Rfl:  .  buPROPion (WELLBUTRIN XL) 150 MG 24 hr tablet, Take 300 mg by mouth daily. Patient takes in the am, Disp: , Rfl:  .  celecoxib (CELEBREX) 200 MG capsule, Take 200 mg by mouth. Reported on 07/10/2015, Disp: , Rfl:  .  cholecalciferol (VITAMIN D) 1000 UNITS tablet, Take 2,000 Units by mouth daily., Disp: , Rfl:  .  escitalopram (LEXAPRO) 20 MG tablet, Take 20 mg by mouth daily. Patient takes at nite, Disp: , Rfl:  .  esomeprazole (NEXIUM) 20 MG capsule, Take 20 mg by mouth 2 (two) times daily., Disp: , Rfl:  .  Ibuprofen (MOTRIN PO), Take by mouth. Patient takes 200mg  as needed, Disp: , Rfl:  .  levothyroxine (SYNTHROID, LEVOTHROID) 200 MCG tablet, Take 200 mcg by mouth daily before breakfast., Disp: , Rfl:  .  levothyroxine (SYNTHROID, LEVOTHROID) 25 MCG tablet, Take 25 mcg by mouth daily before breakfast., Disp: , Rfl:  .  nebivolol (BYSTOLIC) 5 MG tablet, Take 5 mg by mouth daily. Patient takes at nite, Disp: ,  Rfl:  .  ocrelizumab 600 mg in sodium chloride 0.9 % 500 mL, Inject 600 mg into the vein every 6 (six) months., Disp: , Rfl:  .  PRESCRIPTION MEDICATION, Reported on 05/27/2015, Disp: , Rfl:   PAST MEDICAL HISTORY: Past Medical History:  Diagnosis Date  . Anxiety   . Depression   . GERD (gastroesophageal reflux disease)   . Hypertension   . Hypothyroidism   . Multiple sclerosis (HCC)   . Varicose veins     PAST SURGICAL HISTORY: Past Surgical History:  Procedure Laterality Date  . CHOLECYSTECTOMY    . ENDOVENOUS ABLATION SAPHENOUS VEIN W/ LASER Right 07-03-2015   endovenous laser ablation right greater saphenous vein and stab phlebectomy 10-20 incisions right leg by Gretta Began MD  . ENDOVENOUS ABLATION SAPHENOUS VEIN W/ LASER Left 08-28-2015     endovenous laser ablation left greater saphenous vein and stab phlebectomy 10-20 incisions left leg  by Gretta Began MD   . GASTRIC BYPASS    . KNEE ARTHROSCOPY Left 04/19/2013   Procedure: LEFT KNEE ARTHROSCOPY AND DEBRIDEMENT, PARTIAL MEDIAL AND LATERAL MENISCECTOMY AND MEDIAL PATELLOFEMORAL AND LATERAL CHONDROPLASTY ;  Surgeon: Shelda Pal, MD;  Location: [facility];  Service: Orthopedics;  Laterality: Left;  . TONSILLECTOMY  1971    FAMILY HISTORY: Family History  Problem Relation Age of Onset  . Lupus Mother   . Diabetes Mother   . Heart disease Mother   . Heart failure Mother   . Diabetes Father   . Heart disease Father   . COPD Father   . Colon cancer Maternal Grandfather     SOCIAL HISTORY:  Social History   Social History  . Marital status: Married    Spouse name: N/A  . Number of children: 0  . Years of education: BS   Occupational History  . [employer]    Social History Main Topics  . Smoking status: Never Smoker  . Smokeless tobacco: Never Used  . Alcohol use No  . Drug use: No  . Sexual activity: Not on file   Other Topics Concern  . Not on file   Social History Narrative   Drinks 1 cup of tea  a day      PHYSICAL EXAM  Vitals:   09/08/16 1602  BP: 130/76  Pulse: 74  Resp: 18  Weight: 261 lb 8 oz (118.6 kg)  Height: 5' 2.5" (1.588 m)    Body mass index is 47.07 kg/m.   General: The patient is well-developed and well-nourished and in no acute distress   Neurologic Exam  Mental status: The patient is alert and oriented x 3 at the time of the examination. The patient has apparent normal recent and remote memory, with an apparently normal attention span and concentration ability.   Speech is normal.  Cranial nerves: Extraocular movements are full.   Facial strength and sensation is normal. Trapezius and sternocleidomastoid strength is normal. No dysarthria is noted.  The tongue is midline, and the patient has symmetric elevation of the soft palate. No obvious hearing deficits are noted.  Motor:  Muscle bulk is normal.   Tone is normal. Strength is  5 / 5 in all 4 extremities.   Sensory: Sensory testing is intact to touch and vibration sensation in all 4 extremities.  Coordination: Cerebellar testing reveals good finger-nose-finger and heel-to-shin bilaterally.  Gait and station: Station is normal.   Gait is normal. Tandem gait is poor and she needs suport. Romberg is borderline.   Reflexes: Deep tendon reflexes are symmetric and increased bilaterally though she does not have clonus.   Plantar responses are flexor.     DIAGNOSTIC DATA (LABS, IMAGING, TESTING) - I reviewed patient records, labs, notes, testing and imaging myself where available.     ASSESSMENT AND PLAN  Multiple sclerosis (HCC)  Gait disturbance  High risk medication use  Other fatigue  Verbal fluency disorder   1.   She will continue the Ocrevus 600 mg every 6 months. Her next infusion will be in September. 2.    I will check an MRI of the brain after her next infusion. If she shows evidence of subclinical progression, consider a different medication. 3.   Renew Nuvigil for  fatigue. 4.   She will return to see me in 6 months or sooner if there are new or worsening neurologic symptoms. Richard A. Epimenio Foot, MD, PhD 09/08/2016, 6:21 PM Certified  in Neurology, Clinical Neurophysiology, Sleep Medicine, Pain Medicine and Neuroimaging  [facility] [address] [place], [place] [address] [phone]

## 2017-03-03 ENCOUNTER — Telehealth: Payer: Self-pay

## 2017-03-03 NOTE — Telephone Encounter (Signed)
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 The school is not helping her as well as they said they would and she is wondering if Dr. Epimenio Foot would mind writing a letter to help her get an assistant to help her with her daily tasks.

## 2017-03-04 NOTE — Telephone Encounter (Signed)
Spoke with Aram BeechamCynthia and advised specific disabilities and considerations for work are best documented in [employer] paperwork.  She will get this from her employer and bring it to the office.  Specific things pt. has difficult with are: difficulty completing paperwork, gait, balance--difficulty walking to the other end of the building to make copies, fatigue, muscle spasms with prolonged sitting./fim

## 2017-03-15 ENCOUNTER — Ambulatory Visit: Payer: BC Managed Care – PPO | Admitting: Neurology

## 2017-03-16 ENCOUNTER — Encounter: Payer: Self-pay | Admitting: Neurology

## 2017-03-22 ENCOUNTER — Other Ambulatory Visit: Payer: Self-pay | Admitting: Neurology

## 2017-03-22 DIAGNOSIS — Z0289 Encounter for other administrative examinations: Secondary | ICD-10-CM

## 2017-03-23 ENCOUNTER — Encounter (INDEPENDENT_AMBULATORY_CARE_PROVIDER_SITE_OTHER): Payer: Self-pay

## 2017-03-23 ENCOUNTER — Encounter: Payer: Self-pay | Admitting: Neurology

## 2017-03-23 ENCOUNTER — Other Ambulatory Visit: Payer: Self-pay

## 2017-03-23 ENCOUNTER — Ambulatory Visit: Payer: BC Managed Care – PPO | Admitting: Neurology

## 2017-03-23 VITALS — BP 143/86 | HR 64 | Resp 18 | Ht 62.5 in | Wt 255.5 lb

## 2017-03-23 DIAGNOSIS — Z79899 Other long term (current) drug therapy: Secondary | ICD-10-CM | POA: Diagnosis not present

## 2017-03-23 DIAGNOSIS — G35 Multiple sclerosis: Secondary | ICD-10-CM

## 2017-03-23 DIAGNOSIS — R5383 Other fatigue: Secondary | ICD-10-CM | POA: Diagnosis not present

## 2017-03-23 DIAGNOSIS — R269 Unspecified abnormalities of gait and mobility: Secondary | ICD-10-CM | POA: Diagnosis not present

## 2017-03-23 MED ORDER — ARMODAFINIL 200 MG PO TABS: 1.0000 | ORAL_TABLET | Freq: Every day | ORAL | 5 refills | Status: DC

## 2017-03-23 NOTE — Progress Notes (Signed)
[facility]  PATIENT: Angela Coffey DOB: December 21, 1960  REFERRING DOCTOR OR PCP:  Philemon Kingdom (PCP); Quinn Plowman (Neurology) SOURCE: patient, notes from PCP and [facility]; MRI images on PACS, Lab results  _________________________________   HISTORICAL  CHIEF COMPLAINT:  Chief Complaint  Patient presents with  . Multiple Sclerosis    Last Ocrevus infusion was in September.  Sts. she feels better, has more energy since starting Ocrevus/fim    HISTORY OF PRESENT ILLNESS:  Angela Coffey is a 56 yo woman with multiple sclerosis.  Update 03/23/2017:  She had her last Ocrevus infusion in September.   She has tolerated it well.   She feels mostly stable and has no exacerbation since starting.   Her balance is a little off, maybe mildly worse than last visit. However, walking endurance is doing better since starting Ocrevus.    She denies any new numbness, weakness.      She denies any change in vision.   No change in bladder.    This summer, she had a lot more fatigue but is doing better since starting Ocrevus earlier this year.  She had previously been on Rebif.    She is sleeping 7 hours a night.   She falls asleep easily and wakes up a few times but falls right back asleep.   She has 1 x nocturia.     Cognitively, she is doing well.    Mood is doing well.   She takes Vit D 50000 U twice a week as level still low on once a week.    From 09/08/2016:  MS:   She had her first course of ocrelizumab in March 2018. She tolerated the infusions well and there were no complications. She had no infusion reactions at all. Since then, she feels stronger and less fatigue in general.\  Gait/strength/sensation:    She feels her gait is doing better since starting ocrelizumab.    She walked 20 minutes without stopping about a mile which is the most she has walked in several years.  She still staggers slightly though she feels this is mild.   She has one fall hitting her  shoulder when she tried to get dressed standing up.   Leg spasticity is better.,   She is using less baclofen.     Vision:   She denies any MS related problem with her vision.   She has no history of optic neuritis or diplopia.  Bladder/bowel:  She denies any significant problem with bladder or bowel function.  Fatigue/sleep: She notes a little less fatigue on Ocrelizumab than Rebif.  She reports fatigue that is helped by Nuvigil 200 mg daily.   She generally sleeps well at night.  Mood/cognition:    She notes less depression and anxiety. She remains on escitalopram.  She notes mild cognition changes but these are better on ocrelizumab. She has noted some difficulty with word finding and attention and focus.    She takes Vit D 50,000 weekly.  Her maternal grandfather had MS and her mother had SLE.    MS history:   In 2008, she had the onset of numbness in the left arm. When symptoms persisted for a few weeks she was referred to Dr. Adella Hare. Nerve conduction and EMG study was normal so an MRI of the cervical spine was ordered which was abnormal showing foci consistent with MS. An MRI of the brain showed classic MS lesions and she was diagnosed with MS. She was started  on Rebif. She had some injection reactions her first year but generally has tolerated it well.  A couple years ago, she saw Dr. Leona Carry in [place] and Ashok Cordia was tried. However, she did not feel good when she took Aubagio and went back on Rebif. She has had a couple of sensory exacerbations and received some IV steroids.   She had her first course of ocrelizumab in March 2018.   I have reviewed the MRI of the brain and cervical spine from 03/03/2016 and the MRI of the brain from 07/10/2014. The MRI of the brain shows classic periventricular, juxtacortical and deep white matter foci in a pattern and configuration consistent with MS. There are no new lesions and no changes compared to the 2016 MRI. The MRI of the cervical spine showed  3 within the spinal cord, midline and just left of midline at C2-C3, left posteriorly at C4-C5 and right posteriorly at C6-C7. None of the foci appeared to be acute.  REVIEW OF SYSTEMS: Constitutional: No fevers, chills, sweats, or change in appetite.   Notes fatigue Eyes: No visual changes, double vision, eye pain Ear, nose and throat: No hearing loss, ear pain, nasal congestion, sore throat.  Has vertigo.    Cardiovascular: No chest pain, palpitations Respiratory: No shortness of breath at rest or with exertion.   No wheezes GastrointestinaI: No nausea, vomiting, diarrhea, abdominal pain, fecal incontinence Genitourinary: No dysuria, urinary retention or frequency.  No nocturia. Musculoskeletal: Notes neck pain.  No back pain Integumentary: No rash, pruritus, skin lesions Neurological: as above Psychiatric: Notes depression and anxiety Endocrine: No palpitations, diaphoresis, change in appetite, change in weigh or increased thirst Hematologic/Lymphatic: No anemia, purpura, petechiae. Allergic/Immunologic: No itchy/runny eyes, nasal congestion, recent allergic reactions, rashes  ALLERGIES: No Known Allergies  HOME MEDICATIONS:  Current Outpatient Medications:  .  acetaminophen (TYLENOL) 500 MG tablet, Take 500 mg by mouth every 6 (six) hours as needed. Patient takes 2 tablets when she takes Rebif, Disp: , Rfl:  .  Armodafinil 200 MG TABS, take 1 tablet by mouth once daily, Disp: 30 tablet, Rfl: 5 .  B Complex-C (B-COMPLEX WITH VITAMIN C) tablet, Take 1 tablet by mouth daily. Reported on 07/10/2015, Disp: , Rfl:  .  baclofen (LIORESAL) 10 MG tablet, Take 10 mg by mouth at bedtime as needed for muscle spasms., Disp: , Rfl:  .  buPROPion (WELLBUTRIN XL) 150 MG 24 hr tablet, Take 300 mg by mouth daily. Patient takes in the am, Disp: , Rfl:  .  celecoxib (CELEBREX) 200 MG capsule, Take 200 mg by mouth. Reported on 07/10/2015, Disp: , Rfl:  .  cholecalciferol (VITAMIN D) 1000 UNITS tablet,  Take 2,000 Units by mouth daily., Disp: , Rfl:  .  escitalopram (LEXAPRO) 20 MG tablet, Take 20 mg by mouth daily. Patient takes at nite, Disp: , Rfl:  .  esomeprazole (NEXIUM) 20 MG capsule, Take 20 mg by mouth 2 (two) times daily., Disp: , Rfl:  .  Ibuprofen (MOTRIN PO), Take by mouth. Patient takes 200mg  as needed, Disp: , Rfl:  .  levothyroxine (SYNTHROID, LEVOTHROID) 200 MCG tablet, Take 200 mcg by mouth daily before breakfast., Disp: , Rfl:  .  nebivolol (BYSTOLIC) 5 MG tablet, Take 5 mg by mouth daily. Patient takes at nite, Disp: , Rfl:  .  ocrelizumab 600 mg in sodium chloride 0.9 % 500 mL, Inject 600 mg into the vein every 6 (six) months., Disp: , Rfl:  .  ALPRAZolam (XANAX) 0.25 MG  tablet, , Disp: , Rfl: 0 .  buPROPion (WELLBUTRIN XL) 300 MG 24 hr tablet, , Disp: , Rfl: 1 .  levothyroxine (SYNTHROID, LEVOTHROID) 25 MCG tablet, Take 25 mcg by mouth daily before breakfast., Disp: , Rfl:  .  PRESCRIPTION MEDICATION, Reported on 05/27/2015, Disp: , Rfl:   PAST MEDICAL HISTORY: Past Medical History:  Diagnosis Date  . Anxiety   . Depression   . GERD (gastroesophageal reflux disease)   . Hypertension   . Hypothyroidism   . Multiple sclerosis (HCC)   . Varicose veins     PAST SURGICAL HISTORY: Past Surgical History:  Procedure Laterality Date  . CHOLECYSTECTOMY    . ENDOVENOUS ABLATION SAPHENOUS VEIN W/ LASER Right 07-03-2015   endovenous laser ablation right greater saphenous vein and stab phlebectomy 10-20 incisions right leg by Gretta Began MD  . ENDOVENOUS ABLATION SAPHENOUS VEIN W/ LASER Left 08-28-2015     endovenous laser ablation left greater saphenous vein and stab phlebectomy 10-20 incisions left leg  by Gretta Began MD   . GASTRIC BYPASS    . TONSILLECTOMY  1971    FAMILY HISTORY: Family History  Problem Relation Age of Onset  . Lupus Mother   . Diabetes Mother   . Heart disease Mother   . Heart failure Mother   . Diabetes Father   . Heart disease Father   .  COPD Father   . Colon cancer Maternal Grandfather     SOCIAL HISTORY:  Social History   Socioeconomic History  . Marital status: Married    Spouse name: Not on file  . Number of children: 0  . Years of education: BS  . Highest education level: Not on file  Social Needs  . Financial resource strain: Not on file  . Food insecurity - worry: Not on file  . Food insecurity - inability: Not on file  . Transportation needs - medical: Not on file  . Transportation needs - non-medical: Not on file  Occupational History  . Occupation: [employer]  Tobacco Use  . Smoking status: Never Smoker  . Smokeless tobacco: Never Used  Substance and Sexual Activity  . Alcohol use: No    Alcohol/week: 0.0 oz  . Drug use: No  . Sexual activity: Not on file  Other Topics Concern  . Not on file  Social History Narrative   Drinks 1 cup of tea a day      PHYSICAL EXAM  Vitals:   03/23/17 1449  BP: (!) 143/86  Pulse: 64  Resp: 18  Weight: 255 lb 8 oz (115.9 kg)  Height: 5' 2.5" (1.588 m)    Body mass index is 45.99 kg/m.   General: The patient is well-developed and well-nourished and in no acute distress   Neurologic Exam  Mental status: The patient is alert and oriented x 3 at the time of the examination. The patient has apparent normal recent and remote memory, with an apparently normal attention span and concentration ability.   Speech is normal.  Cranial nerves: Extraocular movements are full. Facial strength and sensation is normal. Trapezius strength is strong. No obvious hearing deficits are noted.  Motor:  Muscle bulk is normal.   Tone is normal. Strength is  5 / 5 in all 4 extremities.   Sensory: Sensory testing shows normal and symmetric sensation to touch and vibration in the arms and legs..  Coordination: Cerebellar testing shows good finger nose finger and heel to shin.    Gait and  station: Station is normal.   Gait is normal. Tandem gait is moderately wide but  better than last visit. . Romberg is borderline.   Reflexes: Deep tendon reflexes are symmetric and increased bilaterally.  There is no clonus.    DIAGNOSTIC DATA (LABS, IMAGING, TESTING) - I reviewed patient records, labs, notes, testing and imaging myself where available.     ASSESSMENT AND PLAN  Multiple sclerosis (HCC)  High risk medication use  Gait disturbance  Other fatigue   1.   Continue ocrelizumab 600 mg every 6 months. The next infusion will be in March. We need to check IgG/M/A. We also need to check an MRI of the brain to make sure that there has not been subclinical progression. If present, we will need to consider a different disease modifying therapy.  2.   Renew Nuvigil for fatigue. 3.   She will return to see me in 6 months or sooner if there are new or worsening neurologic symptoms. Masaji Billups A. Epimenio FootSater, MD, PhD 03/23/2017, 3:10 PM Certified in Neurology, Clinical Neurophysiology, Sleep Medicine, Pain Medicine and Neuroimaging  [facility] [address], [address] [place], [place] [address] [phone]

## 2017-03-24 ENCOUNTER — Telehealth: Payer: Self-pay | Admitting: *Deleted

## 2017-03-24 LAB — CBC WITH DIFFERENTIAL/PLATELET
BASOS ABS: 0 10*3/uL (ref 0.0–0.2)
Basos: 0 %
EOS (ABSOLUTE): 0.1 10*3/uL (ref 0.0–0.4)
Eos: 1 %
Hematocrit: 41.3 % (ref 34.0–46.6)
Hemoglobin: 13.4 g/dL (ref 11.1–15.9)
IMMATURE GRANS (ABS): 0 10*3/uL (ref 0.0–0.1)
IMMATURE GRANULOCYTES: 0 %
LYMPHS: 23 %
Lymphocytes Absolute: 2.2 10*3/uL (ref 0.7–3.1)
MCH: 27.9 pg (ref 26.6–33.0)
MCHC: 32.4 g/dL (ref 31.5–35.7)
MCV: 86 fL (ref 79–97)
Monocytes Absolute: 0.7 10*3/uL (ref 0.1–0.9)
Monocytes: 8 %
NEUTROS PCT: 68 %
Neutrophils Absolute: 6.5 10*3/uL (ref 1.4–7.0)
PLATELETS: 343 10*3/uL (ref 150–379)
RBC: 4.81 x10E6/uL (ref 3.77–5.28)
RDW: 14.3 % (ref 12.3–15.4)
WBC: 9.5 10*3/uL (ref 3.4–10.8)

## 2017-03-24 LAB — IGG, IGA, IGM
IGA/IMMUNOGLOBULIN A, SERUM: 145 mg/dL (ref 87–352)
IGM (IMMUNOGLOBULIN M), SRM: 60 mg/dL (ref 26–217)
IgG (Immunoglobin G), Serum: 1305 mg/dL (ref 700–1600)

## 2017-03-24 NOTE — Telephone Encounter (Signed)
Spoke with husband, Angela Coffey, (on dpr) and advised that lab work Angela BeechamCynthia had done in our office is fine.  He verbalized understanding of same and will relay message to pt/fim

## 2017-03-24 NOTE — Telephone Encounter (Signed)
-----   Message from Asa Lente, MD sent at 03/24/2017  8:45 AM EST ----- Please let the patient know that the lab work is fine.

## 2017-03-30 ENCOUNTER — Telehealth: Payer: Self-pay | Admitting: *Deleted

## 2017-03-30 ENCOUNTER — Ambulatory Visit: Payer: BC Managed Care – PPO

## 2017-03-30 DIAGNOSIS — G35 Multiple sclerosis: Secondary | ICD-10-CM | POA: Diagnosis not present

## 2017-03-30 MED ORDER — GADOPENTETATE DIMEGLUMINE 469.01 MG/ML IV SOLN: 20.0000 mL | Freq: Once | INTRAVENOUS | Status: AC | PRN

## 2017-03-30 NOTE — Telephone Encounter (Signed)
Spoke with Angela Coffey this afternoon and reviewed below MRI results with her.  She verbalized understanding of same/fim

## 2017-03-30 NOTE — Telephone Encounter (Signed)
-----   Message from Asa Lente, MD sent at 03/30/2017  3:28 PM EST ----- Please let her know that the MRI is unchanged compared to the one from last year.  No new MS lesions.

## 2017-05-19 ENCOUNTER — Telehealth: Payer: Self-pay | Admitting: *Deleted

## 2017-05-19 MED ORDER — BACLOFEN 10 MG PO TABS: 10.0000 mg | ORAL_TABLET | Freq: Three times a day (TID) | ORAL | 3 refills | Status: DC

## 2017-05-19 NOTE — Telephone Encounter (Signed)
Baclofen escribed to [facility] per faxed request from them/fim

## 2017-08-05 ENCOUNTER — Encounter: Payer: Self-pay | Admitting: Neurology

## 2017-09-02 DIAGNOSIS — M25562 Pain in left knee: Secondary | ICD-10-CM

## 2017-09-02 HISTORY — DX: Pain in left knee: M25.562

## 2017-09-21 ENCOUNTER — Telehealth: Payer: Self-pay | Admitting: *Deleted

## 2017-09-21 ENCOUNTER — Encounter: Payer: Self-pay | Admitting: Neurology

## 2017-09-21 ENCOUNTER — Ambulatory Visit: Payer: BC Managed Care – PPO | Admitting: Neurology

## 2017-09-21 ENCOUNTER — Other Ambulatory Visit: Payer: Self-pay

## 2017-09-21 VITALS — BP 122/71 | HR 69 | Resp 18 | Ht 62.5 in | Wt 266.5 lb

## 2017-09-21 DIAGNOSIS — R5383 Other fatigue: Secondary | ICD-10-CM

## 2017-09-21 DIAGNOSIS — G35 Multiple sclerosis: Secondary | ICD-10-CM | POA: Diagnosis not present

## 2017-09-21 DIAGNOSIS — Z79899 Other long term (current) drug therapy: Secondary | ICD-10-CM | POA: Diagnosis not present

## 2017-09-21 DIAGNOSIS — R269 Unspecified abnormalities of gait and mobility: Secondary | ICD-10-CM | POA: Diagnosis not present

## 2017-09-21 MED ORDER — ARMODAFINIL 200 MG PO TABS: 1.0000 | ORAL_TABLET | Freq: Every day | ORAL | 5 refills | Status: DC

## 2017-09-21 NOTE — Progress Notes (Signed)
[facility]  PATIENT: Angela Coffey DOB: [birth year]  REFERRING DOCTOR OR PCP:  Philemon Kingdom (PCP); Quinn Plowman (Neurology) SOURCE: patient, notes from PCP and [facility]; MRI images on PACS, Lab results  _________________________________   HISTORICAL  CHIEF COMPLAINT:  Chief Complaint  Patient presents with  . Multiple Sclerosis    Sts. she continues to tolerate Ocrevus well.  Last infusion was in March.  Had a little more fatigue right before and for 2 wks. after Ocrevus, but this is improved now/fim    HISTORY OF PRESENT ILLNESS:  Angela Coffey is a 57 yo woman with multiple sclerosis.  Update 09/21/2017: She is tolerating the ocrelizumab infusions well.   She denies any exacerbations.     Her last infusion was 2 months ago.    MRI 04/06/17 showed no new lesions.    IgG/IgA/IgM were fine.    Balance is mildly off and she has some stumbles and a couple falls (last one dog pulled her).  Legs seem strong but the right leg will give out or she notes a foot drop if tired.   Bladder is doing well.   No numbness.     She has had some fatigue and noted more for a few days after the infusion.   There is only  A little fatigue now.    She is sleeping well.  She notes no cognitive issues..     Mood is doing well.     Update 03/23/2017:   She had her last Ocrevus infusion in September.   She has tolerated it well.   She feels mostly stable and has no exacerbation since starting.   Her balance is a little off, maybe mildly worse than last visit. However, walking endurance is doing better since starting Ocrevus.    She denies any new numbness, weakness.      She denies any change in vision.   No change in bladder.    This summer, she had a lot more fatigue but is doing better since starting Ocrevus earlier this year.  She had previously been on Rebif.    She is sleeping 7 hours a night.   She falls asleep easily and wakes up a few times but falls right back  asleep.   She has 1 x nocturia.     Cognitively, she is doing well.    Mood is doing well.   She takes Vit D 50000 U twice a week as level still low on once a week.    From 09/08/2016:  MS:   She had her first course of ocrelizumab in March 2018. She tolerated the infusions well and there were no complications. She had no infusion reactions at all. Since then, she feels stronger and less fatigue in general.\  Gait/strength/sensation:    She feels her gait is doing better since starting ocrelizumab.    She walked 20 minutes without stopping about a mile which is the most she has walked in several years.  She still staggers slightly though she feels this is mild.   She has one fall hitting her shoulder when she tried to get dressed standing up.   Leg spasticity is better.,   She is using less baclofen.     Vision:   She denies any MS related problem with her vision.   She has no history of optic neuritis or diplopia.  Bladder/bowel:  She denies any significant problem with bladder or bowel function.  Fatigue/sleep: She notes  a little less fatigue on Ocrelizumab than Rebif.  She reports fatigue that is helped by Nuvigil 200 mg daily.   She generally sleeps well at night.  Mood/cognition:    She notes less depression and anxiety. She remains on escitalopram.  She notes mild cognition changes but these are better on ocrelizumab. She has noted some difficulty with word finding and attention and focus.    She takes Vit D 50,000 weekly.  Her maternal grandfather had MS and her mother had SLE.    MS history:   In 2008, she had the onset of numbness in the left arm. When symptoms persisted for a few weeks she was referred to Dr. Adella Hare. Nerve conduction and EMG study was normal so an MRI of the cervical spine was ordered which was abnormal showing foci consistent with MS. An MRI of the brain showed classic MS lesions and she was diagnosed with MS. She was started on Rebif. She had some injection  reactions her first year but generally has tolerated it well.  A couple years ago, she saw Dr. Leona Carry in [place] and Ashok Cordia was tried. However, she did not feel good when she took Aubagio and went back on Rebif. She has had a couple of sensory exacerbations and received some IV steroids.   She had her first course of ocrelizumab in March 2018.   I have reviewed the MRI of the brain and cervical spine from 03/03/2016 and the MRI of the brain from 07/10/2014. The MRI of the brain shows classic periventricular, juxtacortical and deep white matter foci in a pattern and configuration consistent with MS. There are no new lesions and no changes compared to the 2016 MRI. The MRI of the cervical spine showed 3 within the spinal cord, midline and just left of midline at C2-C3, left posteriorly at C4-C5 and right posteriorly at C6-C7. None of the foci appeared to be acute.  REVIEW OF SYSTEMS: Constitutional: No fevers, chills, sweats, or change in appetite.   Notes fatigue Eyes: No visual changes, double vision, eye pain Ear, nose and throat: No hearing loss, ear pain, nasal congestion, sore throat.  Has vertigo.    Cardiovascular: No chest pain, palpitations Respiratory: No shortness of breath at rest or with exertion.   No wheezes GastrointestinaI: No nausea, vomiting, diarrhea, abdominal pain, fecal incontinence Genitourinary: No dysuria, urinary retention or frequency.  No nocturia. Musculoskeletal: Notes neck pain.  No back pain Integumentary: No rash, pruritus, skin lesions Neurological: as above Psychiatric: Notes depression and anxiety Endocrine: No palpitations, diaphoresis, change in appetite, change in weigh or increased thirst Hematologic/Lymphatic: No anemia, purpura, petechiae. Allergic/Immunologic: No itchy/runny eyes, nasal congestion, recent allergic reactions, rashes  ALLERGIES: No Known Allergies  HOME MEDICATIONS:  Current Outpatient Medications:  .  acetaminophen (TYLENOL)  500 MG tablet, Take 500 mg by mouth every 6 (six) hours as needed. Patient takes 2 tablets when she takes Rebif, Disp: , Rfl:  .  ALPRAZolam (XANAX) 0.25 MG tablet, , Disp: , Rfl: 0 .  Armodafinil 200 MG TABS, Take 1 tablet daily by mouth., Disp: 30 tablet, Rfl: 5 .  B Complex-C (B-COMPLEX WITH VITAMIN C) tablet, Take 1 tablet by mouth daily. Reported on 07/10/2015, Disp: , Rfl:  .  baclofen (LIORESAL) 10 MG tablet, Take 1 tablet (10 mg total) by mouth 3 (three) times daily., Disp: 270 each, Rfl: 3 .  buPROPion (WELLBUTRIN XL) 150 MG 24 hr tablet, Take 300 mg by mouth daily. Patient takes in the  am, Disp: , Rfl:  .  buPROPion (WELLBUTRIN XL) 300 MG 24 hr tablet, , Disp: , Rfl: 1 .  cholecalciferol (VITAMIN D) 1000 UNITS tablet, Take 2,000 Units by mouth daily., Disp: , Rfl:  .  escitalopram (LEXAPRO) 20 MG tablet, Take 20 mg by mouth daily. Patient takes at nite, Disp: , Rfl:  .  esomeprazole (NEXIUM) 20 MG capsule, Take 20 mg by mouth 2 (two) times daily., Disp: , Rfl:  .  Ibuprofen (MOTRIN PO), Take by mouth. Patient takes 200mg  as needed, Disp: , Rfl:  .  levothyroxine (SYNTHROID, LEVOTHROID) 200 MCG tablet, Take 200 mcg by mouth daily before breakfast., Disp: , Rfl:  .  levothyroxine (SYNTHROID, LEVOTHROID) 25 MCG tablet, Take 25 mcg by mouth daily before breakfast., Disp: , Rfl:  .  nebivolol (BYSTOLIC) 5 MG tablet, Take 5 mg by mouth daily. Patient takes at nite, Disp: , Rfl:  .  ocrelizumab 600 mg in sodium chloride 0.9 % 500 mL, Inject 600 mg into the vein every 6 (six) months., Disp: , Rfl:  .  PRESCRIPTION MEDICATION, Reported on 05/27/2015, Disp: , Rfl:  .  celecoxib (CELEBREX) 200 MG capsule, Take 200 mg by mouth. Reported on 07/10/2015, Disp: , Rfl:  No current facility-administered medications for this visit.   Facility-Administered Medications Ordered in Other Visits:  .  gadopentetate dimeglumine (MAGNEVIST) injection 20 mL, 20 mL, Intravenous, Once PRN, Sater, Pearletha Furl, MD  PAST  MEDICAL HISTORY: Past Medical History:  Diagnosis Date  . Anxiety   . Depression   . GERD (gastroesophageal reflux disease)   . Hypertension   . Hypothyroidism   . Multiple sclerosis (HCC)   . Varicose veins     PAST SURGICAL HISTORY: Past Surgical History:  Procedure Laterality Date  . CHOLECYSTECTOMY    . ENDOVENOUS ABLATION SAPHENOUS VEIN W/ LASER Right 07-03-2015   endovenous laser ablation right greater saphenous vein and stab phlebectomy 10-20 incisions right leg by Gretta Began MD  . ENDOVENOUS ABLATION SAPHENOUS VEIN W/ LASER Left 08-28-2015     endovenous laser ablation left greater saphenous vein and stab phlebectomy 10-20 incisions left leg  by Gretta Began MD   . GASTRIC BYPASS    . KNEE ARTHROSCOPY Left 04/19/2013   Procedure: LEFT KNEE ARTHROSCOPY AND DEBRIDEMENT, PARTIAL MEDIAL AND LATERAL MENISCECTOMY AND MEDIAL PATELLOFEMORAL AND LATERAL CHONDROPLASTY ;  Surgeon: Shelda Pal, MD;  Location: [facility];  Service: Orthopedics;  Laterality: Left;  . TONSILLECTOMY  1971    FAMILY HISTORY: Family History  Problem Relation Age of Onset  . Lupus Mother   . Diabetes Mother   . Heart disease Mother   . Heart failure Mother   . Diabetes Father   . Heart disease Father   . COPD Father   . Colon cancer Maternal Grandfather     SOCIAL HISTORY:  Social History   Socioeconomic History  . Marital status: Married    Spouse name: Not on file  . Number of children: 0  . Years of education: BS  . Highest education level: Not on file  Occupational History  . Occupation: [employer]  Social Needs  . Financial resource strain: Not on file  . Food insecurity:    Worry: Not on file    Inability: Not on file  . Transportation needs:    Medical: Not on file    Non-medical: Not on file  Tobacco Use  . Smoking status: Never Smoker  . Smokeless tobacco: Never Used  Substance and Sexual Activity  . Alcohol use: No    Alcohol/week: 0.0 oz  . Drug use: No  . Sexual  activity: Not on file  Lifestyle  . Physical activity:    Days per week: Not on file    Minutes per session: Not on file  . Stress: Not on file  Relationships  . Social connections:    Talks on phone: Not on file    Gets together: Not on file    Attends religious service: Not on file    Active member of club or organization: Not on file    Attends meetings of clubs or organizations: Not on file    Relationship status: Not on file  . Intimate partner violence:    Fear of current or ex partner: Not on file    Emotionally abused: Not on file    Physically abused: Not on file    Forced sexual activity: Not on file  Other Topics Concern  . Not on file  Social History Narrative   Drinks 1 cup of tea a day      PHYSICAL EXAM  Vitals:   09/21/17 1504  BP: 122/71  Pulse: 69  Resp: 18  Weight: 266 lb 8 oz (120.9 kg)  Height: 5' 2.5" (1.588 m)    Body mass index is 47.97 kg/m.   General: The patient is well-developed and well-nourished and in no acute distress   Neurologic Exam  Mental status: The patient is alert and oriented x 3 at the time of the examination. The patient has apparent normal recent and remote memory, with an apparently normal attention span and concentration ability.   Speech is normal.  Cranial nerves: Extraocular movements are full.  Facial strength and sensation is normal.  Trapezius strength is normal.  No obvious hearing deficits are noted.  Motor:  Muscle bulk is normal.   Muscle tone is normal.  The muscle strength is 5/5 in all limbs  Sensory: Sensory testing shows normal and symmetric sensation to touch and vibration in the arms and legs..  Coordination: Cerebellar testing shows good finger-nose-finger  Gait and station: Station is normal.   Gait is normal. Tandem gait is mildly wide. Romberg is borderline.   Reflexes: Deep tendon reflexes are symmetric and increased bilaterally at the knees and ankles.  No clonus.     DIAGNOSTIC DATA  (LABS, IMAGING, TESTING) - I reviewed patient records, labs, notes, testing and imaging myself where available.     ASSESSMENT AND PLAN  Multiple sclerosis (HCC)  High risk medication use  Gait disturbance  Other fatigue   1.   She will continue ocrevus 600 mg every 6 months. The next infusion will be in September.   2.   Continue Nuvigil for fatigue. 3.   She will return to see me in 6 months or sooner if there are new or worsening neurologic symptoms. Richard A. Epimenio Foot, MD, PhD 09/21/2017, 3:18 PM Certified in Neurology, Clinical Neurophysiology, Sleep Medicine, Pain Medicine and Neuroimaging  [facility] [address] [place], [place] [address] [phone]

## 2017-09-21 NOTE — Telephone Encounter (Signed)
Fax received from [facility].  Armodafinil PA approved for dates 09/21/17 thru 09/22/18.  PA# [facility] [facility] [employer] [facility] Plan [phone] Non-Grandfathered [id number] EC/fim

## 2017-09-21 NOTE — Telephone Encounter (Signed)
PA for Armodafinil 200mg  tablets, #30/30 completed via Cover My Meds.  Key: [id number].  Dx: Fatigue related to MS  (R53.82, G35)/fim

## 2018-03-25 ENCOUNTER — Other Ambulatory Visit: Payer: Self-pay | Admitting: Neurology

## 2018-03-27 NOTE — Telephone Encounter (Signed)
Faxed printed/signed rx to [facility] to [phone]. Received fax confirmation.

## 2018-03-28 ENCOUNTER — Ambulatory Visit: Payer: BC Managed Care – PPO | Admitting: Neurology

## 2018-03-28 ENCOUNTER — Encounter: Payer: Self-pay | Admitting: Neurology

## 2018-03-28 VITALS — BP 110/80 | HR 67 | Ht 62.5 in | Wt 263.5 lb

## 2018-03-28 DIAGNOSIS — G35 Multiple sclerosis: Secondary | ICD-10-CM

## 2018-03-28 DIAGNOSIS — Z79899 Other long term (current) drug therapy: Secondary | ICD-10-CM | POA: Diagnosis not present

## 2018-03-28 DIAGNOSIS — R5383 Other fatigue: Secondary | ICD-10-CM | POA: Diagnosis not present

## 2018-03-28 DIAGNOSIS — R269 Unspecified abnormalities of gait and mobility: Secondary | ICD-10-CM | POA: Diagnosis not present

## 2018-03-28 MED ORDER — ARMODAFINIL 200 MG PO TABS: 1.0000 | ORAL_TABLET | Freq: Every day | ORAL | 5 refills | Status: DC

## 2018-03-28 NOTE — Progress Notes (Signed)
[facility]  PATIENT: Angela Coffey DOB: 1960-08-10  REFERRING DOCTOR OR PCP:  Philemon Kingdom (PCP); Quinn Plowman (Neurology) SOURCE: patient, notes from PCP and [facility]; MRI images on PACS, Lab results  _________________________________   HISTORICAL  CHIEF COMPLAINT:  Chief Complaint  Patient presents with  . Follow-up    RM 12, alone. Last seen 09/21/17. 1 fall in last year. No significant injuries.   . Multiple Sclerosis    On Ocrevus. Last infusion: September 2019. Doing well on medication.  . Gait Problem    Using cane today d/t knee arthritis.    HISTORY OF PRESENT ILLNESS:  Angela Coffey is a 57 y.o. woman with multiple sclerosis.  Update 03/28/2018: She feels her MS has been stable.  She denies any exacerbations.  She is on Ocrevus and she is tolerating it well.  Last infusion was a year ago.  Last MRI was 04/06/2017 and it did not show any new lesions.   Blood work 03/23/2017 showed normal IgG/IgA/IgM and CBC with differential.  MS symptoms are stable.  Gait is reduced due to to poor balance more than weakness. Knee arthritis also affects gait and she gets some knee injections.   She has fallen once in past year.   However, the right leg does give out at times.  She denies any numbness or dysesthesias.  Bladder function is doing well.  She denies any new visual problems.  She saw ophthalmology recently and no new issues.  She has mild fatigue but it is not preventing her from doing activities that she needs to do.  She sleeps well most nights.  Mood has done now since Pristiq was added.   She was having some agitation.    She denies any significant cognitive dysfunction.   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      Update 09/21/2017: She is tolerating the ocrelizumab infusions well.   She denies any exacerbations.     Her last infusion was 2 months ago.    MRI  04/06/17 showed no new lesions.    IgG/IgA/IgM were fine.    Balance is mildly off and she has some stumbles and a couple falls (last one dog pulled her).  Legs seem strong but the right leg will give out or she notes a foot drop if tired.   Bladder is doing well.   No numbness.     She has had some fatigue and noted more for a few days after the infusion.   There is only  A little fatigue now.    She is sleeping well.  She notes no cognitive issues..     Mood is doing well.     Update 03/23/2017:   She had her last Ocrevus infusion in September.   She has tolerated it well.   She feels mostly stable and has no exacerbation since starting.   Her balance is a little off, maybe mildly worse than last visit. However, walking endurance is doing better since starting Ocrevus.    She denies any new numbness, weakness.      She denies any change in vision.   No change in bladder.    This summer, she had a lot more fatigue but is doing better since starting Ocrevus earlier this year.  She had previously been on Rebif.    She is sleeping 7 hours a night.   She  falls asleep easily and wakes up a few times but falls right back asleep.   She has 1 x nocturia.     Cognitively, she is doing well.    Mood is doing well.   She takes Vit D 50000 U twice a week as level still low on once a week.    From 09/08/2016:  MS:   She had her first course of ocrelizumab in March 2018. She tolerated the infusions well and there were no complications. She had no infusion reactions at all. Since then, she feels stronger and less fatigue in general.\  Gait/strength/sensation:    She feels her gait is doing better since starting ocrelizumab.    She walked 20 minutes without stopping about a mile which is the most she has walked in several years.  She still staggers slightly though she feels this is mild.   She has one fall hitting her shoulder when she tried to get dressed standing up.   Leg spasticity is better.,   She is using  less baclofen.     Vision:   She denies any MS related problem with her vision.   She has no history of optic neuritis or diplopia.  Bladder/bowel:  She denies any significant problem with bladder or bowel function.  Fatigue/sleep: She notes a little less fatigue on Ocrelizumab than Rebif.  She reports fatigue that is helped by Nuvigil 200 mg daily.   She generally sleeps well at night.  Mood/cognition:    She notes less depression and anxiety. She remains on escitalopram.  She notes mild cognition changes but these are better on ocrelizumab. She has noted some difficulty with word finding and attention and focus.    She takes Vit D 50,000 weekly.  Her maternal grandfather had MS and her mother had SLE.    MS history:   In 2008, she had the onset of numbness in the left arm. When symptoms persisted for a few weeks she was referred to Dr. Adella Hare. Nerve conduction and EMG study was normal so an MRI of the cervical spine was ordered which was abnormal showing foci consistent with MS. An MRI of the brain showed classic MS lesions and she was diagnosed with MS. She was started on Rebif. She had some injection reactions her first year but generally has tolerated it well.  A couple years ago, she saw Dr. Leona Carry in [place] and Ashok Cordia was tried. However, she did not feel good when she took Aubagio and went back on Rebif. She has had a couple of sensory exacerbations and received some IV steroids.   She had her first course of ocrelizumab in March 2018.   I have reviewed the MRI of the brain and cervical spine from 03/03/2016 and the MRI of the brain from 07/10/2014. The MRI of the brain shows classic periventricular, juxtacortical and deep white matter foci in a pattern and configuration consistent with MS. There are no new lesions and no changes compared to the 2016 MRI. The MRI of the cervical spine showed 3 within the spinal cord, midline and just left of midline at C2-C3, left posteriorly at C4-C5  and right posteriorly at C6-C7. None of the foci appeared to be acute.  REVIEW OF SYSTEMS: Constitutional: No fevers, chills, sweats, or change in appetite.   Notes fatigue Eyes: No visual changes, double vision, eye pain Ear, nose and throat: No hearing loss, ear pain, nasal congestion, sore throat.  Has vertigo.    Cardiovascular: No  chest pain, palpitations Respiratory: No shortness of breath at rest or with exertion.   No wheezes GastrointestinaI: No nausea, vomiting, diarrhea, abdominal pain, fecal incontinence Genitourinary: No dysuria, urinary retention or frequency.  No nocturia. Musculoskeletal: Notes neck pain.  No back pain Integumentary: No rash, pruritus, skin lesions Neurological: as above Psychiatric: Notes depression and anxiety Endocrine: No palpitations, diaphoresis, change in appetite, change in weigh or increased thirst Hematologic/Lymphatic: No anemia, purpura, petechiae. Allergic/Immunologic: No itchy/runny eyes, nasal congestion, recent allergic reactions, rashes  ALLERGIES: No Known Allergies  HOME MEDICATIONS:  Current Outpatient Medications:  .  acetaminophen (TYLENOL) 500 MG tablet, Take 500 mg by mouth every 6 (six) hours as needed. Patient takes 2 tablets when she takes Rebif, Disp: , Rfl:  .  ALPRAZolam (XANAX) 0.25 MG tablet, , Disp: , Rfl: 0 .  Armodafinil 200 MG TABS, Take 1 tablet by mouth daily., Disp: 30 tablet, Rfl: 5 .  B Complex-C (B-COMPLEX WITH VITAMIN C) tablet, Take 1 tablet by mouth daily. Reported on 07/10/2015, Disp: , Rfl:  .  baclofen (LIORESAL) 10 MG tablet, Take 1 tablet (10 mg total) by mouth 3 (three) times daily., Disp: 270 each, Rfl: 3 .  buPROPion (WELLBUTRIN XL) 150 MG 24 hr tablet, Take 300 mg by mouth daily. Patient takes in the am, Disp: , Rfl:  .  buPROPion (WELLBUTRIN XL) 300 MG 24 hr tablet, , Disp: , Rfl: 1 .  cholecalciferol (VITAMIN D) 1000 UNITS tablet, Take 50,000 Units by mouth 2 (two) times a week. , Disp: , Rfl:    .  Desvenlafaxine Succinate ER (PRISTIQ) 25 MG TB24, Take 1 tablet by mouth daily., Disp: , Rfl:  .  escitalopram (LEXAPRO) 10 MG tablet, Take 10 mg by mouth daily., Disp: , Rfl:  .  esomeprazole (NEXIUM) 20 MG capsule, Take 20 mg by mouth 2 (two) times daily., Disp: , Rfl:  .  Ibuprofen (MOTRIN PO), Take by mouth. Patient takes 200mg  as needed, Disp: , Rfl:  .  levothyroxine (SYNTHROID, LEVOTHROID) 200 MCG tablet, Take 200 mcg by mouth daily before breakfast. Takes daily - 7days a week, Disp: , Rfl:  .  levothyroxine (SYNTHROID, LEVOTHROID) 25 MCG tablet, Take 25 mcg by mouth daily before breakfast. Takes Monday-Friday only, Disp: , Rfl:  .  nebivolol (BYSTOLIC) 5 MG tablet, Take 5 mg by mouth daily. Patient takes at nite, Disp: , Rfl:  .  ocrelizumab 600 mg in sodium chloride 0.9 % 500 mL, Inject 600 mg into the vein every 6 (six) months., Disp: , Rfl:  .  PRESCRIPTION MEDICATION, Reported on 05/27/2015, Disp: , Rfl:  .  rosuvastatin (CRESTOR) 5 MG tablet, Take 1 tablet by mouth 3 (three) times a week., Disp: , Rfl: 4 No current facility-administered medications for this visit.   Facility-Administered Medications Ordered in Other Visits:  .  gadopentetate dimeglumine (MAGNEVIST) injection 20 mL, 20 mL, Intravenous, Once PRN, Sater, Pearletha Furl, MD  PAST MEDICAL HISTORY: Past Medical History:  Diagnosis Date  . Anxiety   . Depression   . GERD (gastroesophageal reflux disease)   . Hypertension   . Hypothyroidism   . Multiple sclerosis (HCC)   . Varicose veins     PAST SURGICAL HISTORY: Past Surgical History:  Procedure Laterality Date  . CHOLECYSTECTOMY    . ENDOVENOUS ABLATION SAPHENOUS VEIN W/ LASER Right 07-03-2015   endovenous laser ablation right greater saphenous vein and stab phlebectomy 10-20 incisions right leg by Gretta Began MD  . ENDOVENOUS ABLATION SAPHENOUS  VEIN W/ LASER Left 08-28-2015     endovenous laser ablation left greater saphenous vein and stab phlebectomy 10-20  incisions left leg  by Gretta Began MD   . GASTRIC BYPASS    . KNEE ARTHROSCOPY Left 04/19/2013   Procedure: LEFT KNEE ARTHROSCOPY AND DEBRIDEMENT, PARTIAL MEDIAL AND LATERAL MENISCECTOMY AND MEDIAL PATELLOFEMORAL AND LATERAL CHONDROPLASTY ;  Surgeon: Shelda Pal, MD;  Location: [facility];  Service: Orthopedics;  Laterality: Left;  . TONSILLECTOMY  1971    FAMILY HISTORY: Family History  Problem Relation Age of Onset  . Lupus Mother   . Diabetes Mother   . Heart disease Mother   . Heart failure Mother   . Diabetes Father   . Heart disease Father   . COPD Father   . Colon cancer Maternal Grandfather     SOCIAL HISTORY:  Social History   Socioeconomic History  . Marital status: Married    Spouse name: Not on file  . Number of children: 0  . Years of education: BS  . Highest education level: Not on file  Occupational History  . Occupation: [employer]  Social Needs  . Financial resource strain: Not on file  . Food insecurity:    Worry: Not on file    Inability: Not on file  . Transportation needs:    Medical: Not on file    Non-medical: Not on file  Tobacco Use  . Smoking status: Never Smoker  . Smokeless tobacco: Never Used  Substance and Sexual Activity  . Alcohol use: No    Alcohol/week: 0.0 standard drinks  . Drug use: No  . Sexual activity: Not on file  Lifestyle  . Physical activity:    Days per week: Not on file    Minutes per session: Not on file  . Stress: Not on file  Relationships  . Social connections:    Talks on phone: Not on file    Gets together: Not on file    Attends religious service: Not on file    Active member of club or organization: Not on file    Attends meetings of clubs or organizations: Not on file    Relationship status: Not on file  . Intimate partner violence:    Fear of current or ex partner: Not on file    Emotionally abused: Not on file    Physically abused: Not on file    Forced sexual activity: Not on file  Other  Topics Concern  . Not on file  Social History Narrative   Drinks 1 cup of tea a day      PHYSICAL EXAM  Vitals:   03/28/18 1534  BP: 110/80  Pulse: 67  Weight: 263 lb 8 oz (119.5 kg)  Height: 5' 2.5" (1.588 m)    Body mass index is 47.43 kg/m.   General: The patient is well-developed and well-nourished and in no acute distress   Neurologic Exam  Mental status: The patient is alert and oriented x 3 at the time of the examination. The patient has apparent normal recent and remote memory, with an apparently normal attention span and concentration ability.   Speech is normal.  Cranial nerves: Extraocular movements are full.  Facial strength and sensation is normal.  Trapezius strength is normal..  No obvious hearing deficits are noted.  Motor:  Muscle bulk is normal.   Muscle tone is fairly normal..  The muscle strength is 5/5 in all limbs  Sensory: Sensory testing shows normal  and symmetric sensation to touch and vibration in the arms and legs..  Coordination: She has good finger-nose-finger but reduced heel-to-shin.  Gait and station: Station is normal.   Gait is arthritic (left knee).  The tandem gait is mildly wide.. Romberg is borderline.   Reflexes: Deep tendon reflexes are symmetric and increased bilaterally at the knees and ankles.  There is no ankle clonus.     DIAGNOSTIC DATA (LABS, IMAGING, TESTING) - I reviewed patient records, labs, notes, testing and imaging myself where available.     ASSESSMENT AND PLAN  Multiple sclerosis (HCC) - Plan: IgG, IgA, IgM, CBC with Differential/Platelet  High risk medication use - Plan: IgG, IgA, IgM, CBC with Differential/Platelet  Gait disturbance  Other fatigue   1.   Continue Ocrevus for MS.  Standard dose 600 mg every 6 months.  We will check CBC and IgG/IgM levels today. 2.   Continue Nuvigil for fatigue. 3.   She will return to see me in 6 months or sooner if there are new or worsening neurologic  symptoms. Richard A. Epimenio Foot, MD, PhD 03/28/2018, 3:58 PM Certified in Neurology, Clinical Neurophysiology, Sleep Medicine, Pain Medicine and Neuroimaging  [facility] [address] [place], [place] [address] [phone]

## 2018-03-29 ENCOUNTER — Telehealth: Payer: Self-pay | Admitting: *Deleted

## 2018-03-29 LAB — CBC WITH DIFFERENTIAL/PLATELET
Basophils Absolute: 0 10*3/uL (ref 0.0–0.2)
Basos: 1 %
EOS (ABSOLUTE): 0.1 10*3/uL (ref 0.0–0.4)
EOS: 2 %
HEMATOCRIT: 40.5 % (ref 34.0–46.6)
Hemoglobin: 13.8 g/dL (ref 11.1–15.9)
IMMATURE GRANS (ABS): 0 10*3/uL (ref 0.0–0.1)
IMMATURE GRANULOCYTES: 0 %
LYMPHS: 28 %
Lymphocytes Absolute: 1.8 10*3/uL (ref 0.7–3.1)
MCH: 28 pg (ref 26.6–33.0)
MCHC: 34.1 g/dL (ref 31.5–35.7)
MCV: 82 fL (ref 79–97)
MONOS ABS: 0.7 10*3/uL (ref 0.1–0.9)
Monocytes: 10 %
NEUTROS PCT: 59 %
Neutrophils Absolute: 3.8 10*3/uL (ref 1.4–7.0)
Platelets: 347 10*3/uL (ref 150–450)
RBC: 4.92 x10E6/uL (ref 3.77–5.28)
RDW: 13.1 % (ref 12.3–15.4)
WBC: 6.4 10*3/uL (ref 3.4–10.8)

## 2018-03-29 LAB — IGG, IGA, IGM
IgA/Immunoglobulin A, Serum: 132 mg/dL (ref 87–352)
IgG (Immunoglobin G), Serum: 1193 mg/dL (ref 700–1600)
IgM (Immunoglobulin M), Srm: 45 mg/dL (ref 26–217)

## 2018-03-29 NOTE — Telephone Encounter (Signed)
Faxed printed/signed rx armodafinil to [facility] at [phone]. Received fax confirmation.

## 2018-03-30 ENCOUNTER — Telehealth: Payer: Self-pay | Admitting: *Deleted

## 2018-03-30 NOTE — Telephone Encounter (Signed)
Called and spoke with pt about results per Dr. Sater note. Pt verbalized understanding. 

## 2018-03-30 NOTE — Telephone Encounter (Signed)
-----   Message from Richard A Sater, MD sent at 03/29/2018  4:58 PM EST ----- Please let the patient know that the lab work is fine.  

## 2018-08-02 ENCOUNTER — Telehealth: Payer: Self-pay | Admitting: Neurology

## 2018-08-02 NOTE — Telephone Encounter (Signed)
pt has called for the intrafusion suite, call transferred °

## 2018-08-12 ENCOUNTER — Other Ambulatory Visit: Payer: Self-pay | Admitting: Neurology

## 2018-09-25 ENCOUNTER — Telehealth: Payer: Self-pay | Admitting: *Deleted

## 2018-09-25 NOTE — Telephone Encounter (Signed)
Pt returned call. Pt consented to a Virtual Visit and for the insurance to be billed as such. Pt was informed if any copays applied, they will be billed to her. Pt verbalized understanding. Link was emailed to [email or web address]

## 2018-09-25 NOTE — Telephone Encounter (Signed)
Called pt. Updated medication list, pharmacy,allergies on file. Verified she received email for VV. Explained steps for this. Also sent email for Bonnee Quin, RN to f/u with pt to get her scheduled for next Ocrevus infusion. She is due around mid/late October 2020.

## 2018-09-25 NOTE — Addendum Note (Signed)
Addended by: Hillis Range on: 09/25/2018 03:04 PM   Modules accepted: Orders

## 2018-09-25 NOTE — Telephone Encounter (Signed)
LVM for pt to call today about her appt tomorrow  due to current COVID-19 pandemic, our office is severely reducing in office visits in order to minimize the risk to our patients and healthcare providers. We would like to convert her to a doxy.me virtual visit if she is okay with that.

## 2018-09-26 ENCOUNTER — Encounter: Payer: Self-pay | Admitting: Neurology

## 2018-09-26 ENCOUNTER — Other Ambulatory Visit: Payer: Self-pay

## 2018-09-26 ENCOUNTER — Ambulatory Visit (INDEPENDENT_AMBULATORY_CARE_PROVIDER_SITE_OTHER): Payer: BC Managed Care – PPO | Admitting: Neurology

## 2018-09-26 DIAGNOSIS — R5383 Other fatigue: Secondary | ICD-10-CM

## 2018-09-26 DIAGNOSIS — G35 Multiple sclerosis: Secondary | ICD-10-CM

## 2018-09-26 DIAGNOSIS — R269 Unspecified abnormalities of gait and mobility: Secondary | ICD-10-CM

## 2018-09-26 DIAGNOSIS — Z79899 Other long term (current) drug therapy: Secondary | ICD-10-CM

## 2018-09-26 MED ORDER — ARMODAFINIL 200 MG PO TABS: 1.0000 | ORAL_TABLET | Freq: Every day | ORAL | 5 refills | Status: DC

## 2018-09-26 NOTE — Telephone Encounter (Signed)
Received message from [place], [place] that pt scheduled for  Feb 20, 2019 at 10:00. She is going to call to remind her of appt.

## 2018-09-26 NOTE — Progress Notes (Signed)
[facility]  PATIENT: Angela Coffey DOB: 1961-04-19  REFERRING DOCTOR OR PCP:  Philemon Kingdom (PCP); Quinn Plowman (Neurology) SOURCE: patient, notes from PCP and [facility]; MRI images on PACS, Lab results  _________________________________   HISTORICAL  CHIEF COMPLAINT:  No chief complaint on file.   HISTORY OF PRESENT ILLNESS:  Angela Coffey is a 58 y.o. woman with multiple sclerosis.  Update 09/26/18: Virtual Visit via Video Note I connected with Tracie Harrier on 09/26/18 at  3:00 PM EDT by a video enabled telemedicine application and verified that I am speaking with the correct person.  I discussed the limitations of evaluation and management by telemedicine and the availability of in person appointments. The patient expressed understanding and agreed to proceed.  History of Present Illness: She is on Ocrevus and tolerates it well (no infusion issues or infections).   Her last infusion was in April 2020,  next infusion is in October.  IgG and IgM were fine when last checked 6 months ago.     Gait is doing well.   She can go up/down stairs easily but holds the bannister.   The right leg is a little weaker than the left and feels heavier when she is more tired.   She denies numbness or tingling.   Bladder is doing well.   Vision has done well.  She has some fatigue, worse the month before her last infusion. She takes Nuvigil prn.    She is sleeping well.   She was getting more aggravated easily but this improved with thyroid medication changes.   Cognition is good though she sometimes has word finding issues if tired.    She is taking Vit D 50,000 Units twice a week due to low levels.   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    Observations/Objective:   She is a well-developed well-nourished woman in no acute distress.  The head is normocephalic and atraumatic.  Sclera are anicteric.  Visible skin appears normal.  The neck has a good  range of motion.  Pharynx and tongue have normal appearance.  She is alert and fully oriented with fluent speech and good attention, knowledge and memory.  Extraocular muscles are intact.  Facial strength is normal.  Palatal elevation and tongue protrusion are midline.  She appears to have normal strength in the arms.  Rapid alternating movements and finger-nose-finger are performed well.  Assessment and Plan: Multiple sclerosis (HCC)  Gait disturbance  High risk medication use  Other fatigue  1.   Continue Ocrevus.   We discussed possibility of more Covid-19 risks on Ocrevus and she needs to continue social distancing, hand washing and other CDC guidance.   2.   Stay active, exercise as tolerated 3.   Continue Vit D supplementation 4.   rtc 6 months, sooner if new or worsening issues Follow Up Instructions: I discussed the assessment and treatment plan with the patient. The patient was provided an opportunity to ask questions and all were answered. The patient agreed with the plan and demonstrated an understanding of the instructions.    The patient was advised to call back or seek an in-person evaluation if the symptoms worsen or if the condition fails to improve as anticipated.  I provided 20 minutes of non-face-to-face time during this encounter.  _______________ From previous visits: Update 03/28/2018: She feels her MS has been stable.  She denies any exacerbations.  She is on Ocrevus and she is  tolerating it well.  Last infusion was a year ago.  Last MRI was 04/06/2017 and it did not show any new lesions.   Blood work 03/23/2017 showed normal IgG/IgA/IgM and CBC with differential.  MS symptoms are stable.  Gait is reduced due to to poor balance more than weakness. Knee arthritis also affects gait and she gets some knee injections.   She has fallen once in past year.   However, the right leg does give out at times.  She denies any numbness or dysesthesias.  Bladder function is doing  well.  She denies any new visual problems.  She saw ophthalmology recently and no new issues.  She has mild fatigue but it is not preventing her from doing activities that she needs to do.  She sleeps well most nights.  Mood has done now since Pristiq was added.   She was having some agitation.    She denies any significant cognitive dysfunction.   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      Update 09/21/2017: She is tolerating the ocrelizumab infusions well.   She denies any exacerbations.     Her last infusion was 2 months ago.    MRI 04/06/17 showed no new lesions.    IgG/IgA/IgM were fine.    Balance is mildly off and she has some stumbles and a couple falls (last one dog pulled her).  Legs seem strong but the right leg will give out or she notes a foot drop if tired.   Bladder is doing well.   No numbness.     She has had some fatigue and noted more for a few days after the infusion.   There is only  A little fatigue now.    She is sleeping well.  She notes no cognitive issues..     Mood is doing well.     Update 03/23/2017:   She had her last Ocrevus infusion in September.   She has tolerated it well.   She feels mostly stable and has no exacerbation since starting.   Her balance is a little off, maybe mildly worse than last visit. However, walking endurance is doing better since starting Ocrevus.    She denies any new numbness, weakness.      She denies any change in vision.   No change in bladder.    This summer, she had a lot more fatigue but is doing better since starting Ocrevus earlier this year.  She had previously been on Rebif.    She is sleeping 7 hours a night.   She falls asleep easily and wakes up a few times but falls right back asleep.   She has 1 x nocturia.     Cognitively, she is doing well.    Mood is doing well.   She takes Vit D 50000 U twice a week as level still low on once a week.    From  09/08/2016:  MS:   She had her first course of ocrelizumab in March 2018. She tolerated the infusions well and there were no complications. She had no infusion reactions at all. Since then, she feels stronger and less fatigue in general.\  Gait/strength/sensation:    She feels her gait is doing better since starting ocrelizumab.    She walked 20 minutes without stopping about a mile which is the most she has walked in several years.  She still  staggers slightly though she feels this is mild.   She has one fall hitting her shoulder when she tried to get dressed standing up.   Leg spasticity is better.,   She is using less baclofen.     Vision:   She denies any MS related problem with her vision.   She has no history of optic neuritis or diplopia.  Bladder/bowel:  She denies any significant problem with bladder or bowel function.  Fatigue/sleep: She notes a little less fatigue on Ocrelizumab than Rebif.  She reports fatigue that is helped by Nuvigil 200 mg daily.   She generally sleeps well at night.  Mood/cognition:    She notes less depression and anxiety. She remains on escitalopram.  She notes mild cognition changes but these are better on ocrelizumab. She has noted some difficulty with word finding and attention and focus.    She takes Vit D 50,000 weekly.  Her maternal grandfather had MS and her mother had SLE.    MS history:   In 2008, she had the onset of numbness in the left arm. When symptoms persisted for a few weeks she was referred to Dr. Adella Hare. Nerve conduction and EMG study was normal so an MRI of the cervical spine was ordered which was abnormal showing foci consistent with MS. An MRI of the brain showed classic MS lesions and she was diagnosed with MS. She was started on Rebif. She had some injection reactions her first year but generally has tolerated it well.  A couple years ago, she saw Dr. Leona Carry in [place] and Ashok Cordia was tried. However, she did not feel good when she  took Aubagio and went back on Rebif. She has had a couple of sensory exacerbations and received some IV steroids.   She had her first course of ocrelizumab in March 2018.   I have reviewed the MRI of the brain and cervical spine from 03/03/2016 and the MRI of the brain from 07/10/2014. The MRI of the brain shows classic periventricular, juxtacortical and deep white matter foci in a pattern and configuration consistent with MS. There are no new lesions and no changes compared to the 2016 MRI. The MRI of the cervical spine showed 3 within the spinal cord, midline and just left of midline at C2-C3, left posteriorly at C4-C5 and right posteriorly at C6-C7. None of the foci appeared to be acute.  REVIEW OF SYSTEMS: Constitutional: No fevers, chills, sweats, or change in appetite.   Notes fatigue Eyes: No visual changes, double vision, eye pain Ear, nose and throat: No hearing loss, ear pain, nasal congestion, sore throat.  Has vertigo.    Cardiovascular: No chest pain, palpitations Respiratory: No shortness of breath at rest or with exertion.   No wheezes GastrointestinaI: No nausea, vomiting, diarrhea, abdominal pain, fecal incontinence Genitourinary: No dysuria, urinary retention or frequency.  No nocturia. Musculoskeletal: Notes neck pain.  No back pain Integumentary: No rash, pruritus, skin lesions Neurological: as above Psychiatric: Notes depression and anxiety Endocrine: No palpitations, diaphoresis, change in appetite, change in weigh or increased thirst Hematologic/Lymphatic: No anemia, purpura, petechiae. Allergic/Immunologic: No itchy/runny eyes, nasal congestion, recent allergic reactions, rashes  ALLERGIES: No Known Allergies  HOME MEDICATIONS:  Current Outpatient Medications:  .  acetaminophen (TYLENOL) 500 MG tablet, Take 500 mg by mouth every 6 (six) hours as needed. Patient takes 2 tablets when she takes Rebif, Disp: , Rfl:  .  ALPRAZolam (XANAX) 0.25 MG tablet, Take 0.25 mg by  mouth as  needed. , Disp: , Rfl: 0 .  Armodafinil 200 MG TABS, Take 1 tablet by mouth daily. (Patient taking differently: Take 1 tablet by mouth as needed. ), Disp: 30 tablet, Rfl: 5 .  baclofen (LIORESAL) 10 MG tablet, TAKE 1 TABLET BY MOUTH THREE TIMES A DAY. (Patient taking differently: Take 10 mg by mouth daily. ), Disp: 270 tablet, Rfl: 0 .  buPROPion (WELLBUTRIN XL) 150 MG 24 hr tablet, Take 300 mg by mouth daily. Patient takes in the am ( ), Disp: , Rfl:  .  cholecalciferol (VITAMIN D) 1000 UNITS tablet, Take 50,000 Units by mouth 2 (two) times a week. , Disp: , Rfl:  .  Desvenlafaxine Succinate ER (PRISTIQ) 25 MG TB24, Take 1 tablet by mouth daily., Disp: , Rfl:  .  escitalopram (LEXAPRO) 10 MG tablet, Take 10 mg by mouth daily., Disp: , Rfl:  .  esomeprazole (NEXIUM) 20 MG capsule, Take 20 mg by mouth 2 (two) times daily., Disp: , Rfl:  .  Ibuprofen (MOTRIN PO), Take by mouth. Patient takes  as needed, Disp: , Rfl:  .  levothyroxine (SYNTHROID, LEVOTHROID) 200 MCG tablet, Take 200 mcg by mouth daily before breakfast. Takes daily - 7days a week, Disp: , Rfl:  .  levothyroxine (SYNTHROID, LEVOTHROID) 25 MCG tablet, Take 25 mcg by mouth daily before breakfast. Takes Monday-Friday only, Disp: , Rfl:  .  nebivolol (BYSTOLIC) 5 MG tablet, Take 5 mg by mouth daily. Patient takes at nite, Disp: , Rfl:  .  ocrelizumab 600 mg in sodium chloride 0.9 % 500 mL, Inject 600 mg into the vein every 6 (six) months., Disp: , Rfl:  .  PRESCRIPTION MEDICATION, Reported on 05/27/2015, Disp: , Rfl:  .  rosuvastatin (CRESTOR) 5 MG tablet, Take 1 tablet by mouth 3 (three) times a week., Disp: , Rfl: 4 No current facility-administered medications for this visit.   Facility-Administered Medications Ordered in Other Visits:  .  gadopentetate dimeglumine (MAGNEVIST) injection 20 mL, 20 mL, Intravenous, Once PRN, Demiana Crumbley, Pearletha Furl, MD  PAST MEDICAL HISTORY: Past Medical History:  Diagnosis Date  . Anxiety    . Depression   . GERD (gastroesophageal reflux disease)   . Hypertension   . Hypothyroidism   . Multiple sclerosis (HCC)   . Varicose veins     PAST SURGICAL HISTORY: Past Surgical History:  Procedure Laterality Date  . CHOLECYSTECTOMY    . ENDOVENOUS ABLATION SAPHENOUS VEIN W/ LASER Right 07-03-2015   endovenous laser ablation right greater saphenous vein and stab phlebectomy 10-20 incisions right leg by Gretta Began MD  . ENDOVENOUS ABLATION SAPHENOUS VEIN W/ LASER Left 08-28-2015     endovenous laser ablation left greater saphenous vein and stab phlebectomy 10-20 incisions left leg  by Gretta Began MD   . GASTRIC BYPASS    . KNEE ARTHROSCOPY Left 04/19/2013   Procedure: LEFT KNEE ARTHROSCOPY AND DEBRIDEMENT, PARTIAL MEDIAL AND LATERAL MENISCECTOMY AND MEDIAL PATELLOFEMORAL AND LATERAL CHONDROPLASTY ;  Surgeon: Shelda Pal, MD;  Location: [facility];  Service: Orthopedics;  Laterality: Left;  . TONSILLECTOMY  1971    FAMILY HISTORY: Family History  Problem Relation Age of Onset  . Lupus Mother   . Diabetes Mother   . Heart disease Mother   . Heart failure Mother   . Diabetes Father   . Heart disease Father   . COPD Father   . Colon cancer Maternal Grandfather     SOCIAL HISTORY:  Social History   Socioeconomic History  .  Marital status: Married    Spouse name: Not on file  . Number of children: 0  . Years of education: BS  . Highest education level: Not on file  Occupational History  . Occupation: [employer]  Social Needs  . Financial resource strain: Not on file  . Food insecurity:    Worry: Not on file    Inability: Not on file  . Transportation needs:    Medical: Not on file    Non-medical: Not on file  Tobacco Use  . Smoking status: Never Smoker  . Smokeless tobacco: Never Used  Substance and Sexual Activity  . Alcohol use: No    Alcohol/week: 0.0 standard drinks  . Drug use: No  . Sexual activity: Not on file  Lifestyle  . Physical activity:     Days per week: Not on file    Minutes per session: Not on file  . Stress: Not on file  Relationships  . Social connections:    Talks on phone: Not on file    Gets together: Not on file    Attends religious service: Not on file    Active member of club or organization: Not on file    Attends meetings of clubs or organizations: Not on file    Relationship status: Not on file  . Intimate partner violence:    Fear of current or ex partner: Not on file    Emotionally abused: Not on file    Physically abused: Not on file    Forced sexual activity: Not on file  Other Topics Concern  . Not on file  Social History Narrative   Drinks 1 cup of tea a day      PHYSICAL EXAM  There were no vitals filed for this visit.  There is no height or weight on file to calculate BMI.   General: The patient is well-developed and well-nourished and in no acute distress   Neurologic Exam  Mental status: The patient is alert and oriented x 3 at the time of the examination. The patient has apparent normal recent and remote memory, with an apparently normal attention span and concentration ability.   Speech is normal.  Cranial nerves: Extraocular movements are full.  Facial strength and sensation is normal.  Trapezius strength is normal..  No obvious hearing deficits are noted.  Motor:  Muscle bulk is normal.   Muscle tone is fairly normal..  The muscle strength is 5/5 in all limbs  Sensory: Sensory testing shows normal and symmetric sensation to touch and vibration in the arms and legs..  Coordination: She has good finger-nose-finger but reduced heel-to-shin.  Gait and station: Station is normal.   Gait is arthritic (left knee).  The tandem gait is mildly wide.. Romberg is borderline.   Reflexes: Deep tendon reflexes are symmetric and increased bilaterally at the knees and ankles.  There is no ankle clonus.     DIAGNOSTIC DATA (LABS, IMAGING, TESTING) - I reviewed patient records, labs,  notes, testing and imaging myself where available.     ASSESSMENT AND PLAN  Multiple sclerosis (HCC)  Gait disturbance  High risk medication use  Other fatigue  Jene Oravec A. Epimenio Foot, MD, PhD 09/26/2018, 3:13 PM Certified in Neurology, Clinical Neurophysiology, Sleep Medicine, Pain Medicine and Neuroimaging  [facility] [address] [place], [place] [address] [phone]

## 2018-10-06 ENCOUNTER — Other Ambulatory Visit: Payer: Self-pay | Admitting: Neurology

## 2018-10-09 NOTE — Telephone Encounter (Signed)
[place] Database Verified LR: 09/09/2018  Qty: 30 Pending appointment: No pending appt

## 2018-11-11 ENCOUNTER — Other Ambulatory Visit: Payer: Self-pay | Admitting: Neurology

## 2018-11-29 ENCOUNTER — Telehealth: Payer: Self-pay | Admitting: Neurology

## 2018-11-29 ENCOUNTER — Encounter: Payer: Self-pay | Admitting: *Deleted

## 2018-11-29 NOTE — Telephone Encounter (Signed)
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 Please advise.

## 2018-11-29 NOTE — Telephone Encounter (Signed)
Letter printed, waiting on MD signature °

## 2018-11-29 NOTE — Telephone Encounter (Signed)
Spoke with Dr. Felecia Shelling, okay to provide letter

## 2018-11-29 NOTE — Telephone Encounter (Signed)
Placed letter in mail as requested.  

## 2018-11-29 NOTE — Telephone Encounter (Signed)
Pt would like letter mailed.  

## 2018-11-29 NOTE — Telephone Encounter (Signed)
Called, LVM for pt to call office. Letter ready.  Wanting to know if she wants to pick up letter from office or have [place] mail it to her.

## 2019-01-22 ENCOUNTER — Telehealth: Payer: Self-pay | Admitting: *Deleted

## 2019-01-22 NOTE — Telephone Encounter (Signed)
Called, LVM for pt to call office and schedule f/u around 03/29/2019 with either Dr. Felecia Shelling or Amy L,NP.

## 2019-03-05 ENCOUNTER — Telehealth: Payer: Self-pay | Admitting: *Deleted

## 2019-03-05 NOTE — Telephone Encounter (Signed)
PA armodafinil submitted on CMM. Key: [id number] - PA Case ID: [id number] - Rx #: [id number].  Approval dates: 03/04/2019 - 03/03/2020. PA# [place] [phone] Non-Grandfathered [id number].

## 2019-04-04 ENCOUNTER — Telehealth: Payer: Self-pay

## 2019-04-04 ENCOUNTER — Other Ambulatory Visit: Payer: Self-pay

## 2019-04-04 ENCOUNTER — Encounter: Payer: Self-pay | Admitting: Neurology

## 2019-04-04 ENCOUNTER — Ambulatory Visit: Payer: BC Managed Care – PPO | Admitting: Neurology

## 2019-04-04 DIAGNOSIS — Z79899 Other long term (current) drug therapy: Secondary | ICD-10-CM | POA: Diagnosis not present

## 2019-04-04 DIAGNOSIS — R5383 Other fatigue: Secondary | ICD-10-CM

## 2019-04-04 DIAGNOSIS — R269 Unspecified abnormalities of gait and mobility: Secondary | ICD-10-CM | POA: Diagnosis not present

## 2019-04-04 DIAGNOSIS — G35 Multiple sclerosis: Secondary | ICD-10-CM

## 2019-04-04 DIAGNOSIS — R29818 Other symptoms and signs involving the nervous system: Secondary | ICD-10-CM

## 2019-04-04 HISTORY — DX: Other symptoms and signs involving the nervous system: R29.818

## 2019-04-04 MED ORDER — PHENTERMINE HCL 37.5 MG PO CAPS: 37.5000 mg | ORAL_CAPSULE | ORAL | 1 refills | Status: DC

## 2019-04-04 NOTE — Progress Notes (Signed)
[facility]  PATIENT: Angela Coffey DOB: 08-31-1960  REFERRING DOCTOR OR PCP:  Philemon Kingdom (PCP); Quinn Plowman (Neurology) SOURCE: patient, notes from PCP and [facility]; MRI images on PACS, Lab results  _________________________________   HISTORICAL  CHIEF COMPLAINT:  Chief Complaint  Patient presents with  . Follow-up    Rm 12 here for 6 month f/u on MS reports over the last month increase elictrical shocks in her spine and increased fatigue     HISTORY OF PRESENT ILLNESS:  Angela Coffey is a 58 y.o. woman with relapsing remitting multiple sclerosis.  Update 04/04/2019: She is on Ocrevus for relapsing remitting MS.    She tolerates Ocrevus well.   Hr last infusion was early October.  Last MRI was 04/06/2017 and it did not show any new lesions.   Blood work 03/23/2017 showed normal IgG/IgA/IgM and CBC with differential.     She reports two episodes of 'electric shock' in the spine lasting just a second but intense.  She was standing at the time and the shock occurred in the lower thoracic/upper lumbar region.   She had not had these since before diagnosis.    She notes no new weakness, numbness, gait or bladder.  Her right leg has mild stable weakness and spasticity.   Baclofen helps.  The MRI of the cervical spine 02/28/2016 showed 3 within the spinal cord, midline and just left of midline at C2-C3, left posteriorly at C4-C5 and right posteriorly at C6-C7.   Vision is fine  She has some fatigue but it is not limiting her most days but she feels less apathetic on days off work than she used to be.   Nuvigil has helped  She has depression helped by buproprion and Lexapro.     Update 09/26/18 (virtual) She is on Ocrevus and tolerates it well (no infusion issues or infections).   Her last infusion was in April 2020,  next infusion is in October.  IgG and IgM were fine when last checked 6 months ago.     Gait is doing well.   She can go up/down stairs  easily but holds the bannister.   The right leg is a little weaker than the left and feels heavier when she is more tired.   She denies numbness or tingling.   Bladder is doing well.   Vision has done well.  She has some fatigue, worse the month before her last infusion. She takes Nuvigil prn.    She is sleeping well.   She was getting more aggravated easily but this improved with thyroid medication changes.   Cognition is good though she sometimes has word finding issues if tired.    She is taking Vit D 50,000 Units twice a week due to low levels.   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     Update 03/28/2018: She feels her MS has been stable.  She denies any exacerbations.  She is on Ocrevus and she is tolerating it well.  Last infusion was a year ago.  Last MRI was 04/06/2017 and it did not show any new lesions.   Blood work 03/23/2017 showed normal IgG/IgA/IgM and CBC with differential.  MS symptoms are stable.  Gait is reduced due to to poor balance more than weakness. Knee arthritis also affects gait and she gets some knee injections.   She has fallen once in past year.   However, the right leg does give out at  times.  She denies any numbness or dysesthesias.  Bladder function is doing well.  She denies any new visual problems.  She saw ophthalmology recently and no new issues.  She has mild fatigue but it is not preventing her from doing activities that she needs to do.  She sleeps well most nights.  Mood has done now since Pristiq was added.   She was having some agitation.    She denies any significant cognitive dysfunction.   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      Update 09/21/2017: She is tolerating the ocrelizumab infusions well.   She denies any exacerbations.     Her last infusion was 2 months ago.    MRI 04/06/17 showed no new lesions.    IgG/IgA/IgM were fine.    Balance is mildly off and  she has some stumbles and a couple falls (last one dog pulled her).  Legs seem strong but the right leg will give out or she notes a foot drop if tired.   Bladder is doing well.   No numbness.     She has had some fatigue and noted more for a few days after the infusion.   There is only  A little fatigue now.    She is sleeping well.  She notes no cognitive issues..     Mood is doing well.     Update 03/23/2017:   She had her last Ocrevus infusion in September.   She has tolerated it well.   She feels mostly stable and has no exacerbation since starting.   Her balance is a little off, maybe mildly worse than last visit. However, walking endurance is doing better since starting Ocrevus.    She denies any new numbness, weakness.      She denies any change in vision.   No change in bladder.    This summer, she had a lot more fatigue but is doing better since starting Ocrevus earlier this year.  She had previously been on Rebif.    She is sleeping 7 hours a night.   She falls asleep easily and wakes up a few times but falls right back asleep.   She has 1 x nocturia.     Cognitively, she is doing well.    Mood is doing well.   She takes Vit D 50000 U twice a week as level still low on once a week.    From 09/08/2016:  MS:   She had her first course of ocrelizumab in March 2018. She tolerated the infusions well and there were no complications. She had no infusion reactions at all. Since then, she feels stronger and less fatigue in general.\  Gait/strength/sensation:    She feels her gait is doing better since starting ocrelizumab.    She walked 20 minutes without stopping about a mile which is the most she has walked in several years.  She still staggers slightly though she feels this is mild.   She has one fall hitting her shoulder when she tried to get dressed standing up.   Leg spasticity is better.,   She is using less baclofen.     Vision:   She denies any MS related problem with her vision.   She has  no history of optic neuritis or diplopia.  Bladder/bowel:  She denies any significant problem with bladder or bowel function.  Fatigue/sleep: She notes  a little less fatigue on Ocrelizumab than Rebif.  She reports fatigue that is helped by Nuvigil 200 mg daily.   She generally sleeps well at night.  Mood/cognition:    She notes less depression and anxiety. She remains on escitalopram.  She notes mild cognition changes but these are better on ocrelizumab. She has noted some difficulty with word finding and attention and focus.    She takes Vit D 50,000 weekly.  Her maternal grandfather had MS and her mother had SLE.    MS history:   In 2008, she had the onset of numbness in the left arm. When symptoms persisted for a few weeks she was referred to Dr. Adella Hare. Nerve conduction and EMG study was normal so an MRI of the cervical spine was ordered which was abnormal showing foci consistent with MS. An MRI of the brain showed classic MS lesions and she was diagnosed with MS. She was started on Rebif. She had some injection reactions her first year but generally has tolerated it well.  A couple years ago, she saw Dr. Leona Carry in [place] and Ashok Cordia was tried. However, she did not feel good when she took Aubagio and went back on Rebif. She has had a couple of sensory exacerbations and received some IV steroids.   She had her first course of ocrelizumab in March 2018.   I have reviewed the MRI of the brain and cervical spine from 03/03/2016 and the MRI of the brain from 07/10/2014. The MRI of the brain shows classic periventricular, juxtacortical and deep white matter foci in a pattern and configuration consistent with MS. There are no new lesions and no changes compared to the 2016 MRI. The MRI of the cervical spine showed 3 within the spinal cord, midline and just left of midline at C2-C3, left posteriorly at C4-C5 and right posteriorly at C6-C7. None of the foci appeared to be acute.  REVIEW OF  SYSTEMS: Constitutional: No fevers, chills, sweats, or change in appetite.   Notes fatigue Eyes: No visual changes, double vision, eye pain Ear, nose and throat: No hearing loss, ear pain, nasal congestion, sore throat.  Has vertigo.    Cardiovascular: No chest pain, palpitations Respiratory: No shortness of breath at rest or with exertion.   No wheezes GastrointestinaI: No nausea, vomiting, diarrhea, abdominal pain, fecal incontinence Genitourinary: No dysuria, urinary retention or frequency.  No nocturia. Musculoskeletal: Notes neck pain.  No back pain Integumentary: No rash, pruritus, skin lesions Neurological: as above Psychiatric: Notes depression and anxiety Endocrine: No palpitations, diaphoresis, change in appetite, change in weigh or increased thirst Hematologic/Lymphatic: No anemia, purpura, petechiae. Allergic/Immunologic: No itchy/runny eyes, nasal congestion, recent allergic reactions, rashes  ALLERGIES: No Known Allergies  HOME MEDICATIONS:  Current Outpatient Medications:  .  acetaminophen (TYLENOL) 500 MG tablet, Take 500 mg by mouth every 6 (six) hours as needed. Patient takes 2 tablets when she takes Rebif, Disp: , Rfl:  .  ALPRAZolam (XANAX) 0.25 MG tablet, Take 0.25 mg by mouth as needed. , Disp: , Rfl: 0 .  Armodafinil 200 MG TABS, TAKE 1 TABLET BY MOUTH DAILY, Disp: 30 tablet, Rfl: 5 .  baclofen (LIORESAL) 10 MG tablet, TAKE 1 TABLET BY MOUTH THREE TIMES DAILY, Disp: 270 tablet, Rfl: 1 .  buPROPion (WELLBUTRIN XL) 150 MG 24 hr tablet, Take 300 mg by mouth daily. Patient takes in the am (450mg ), Disp: , Rfl:  .  cholecalciferol (VITAMIN D) 1000 UNITS tablet, Take 50,000 Units by mouth 2 (  two) times a week. , Disp: , Rfl:  .  Desvenlafaxine Succinate ER (PRISTIQ) 25 MG TB24, Take 1 tablet by mouth daily., Disp: , Rfl:  .  escitalopram (LEXAPRO) 10 MG tablet, Take 10 mg by mouth daily., Disp: , Rfl:  .  esomeprazole (NEXIUM) 20 MG capsule, Take 20 mg by mouth 2  (two) times daily., Disp: , Rfl:  .  Ibuprofen (MOTRIN PO), Take by mouth. Patient takes 200mg  as needed, Disp: , Rfl:  .  levothyroxine (SYNTHROID, LEVOTHROID) 200 MCG tablet, Take 200 mcg by mouth daily before breakfast. Takes daily - 7days a week, Disp: , Rfl:  .  nebivolol (BYSTOLIC) 5 MG tablet, Take 5 mg by mouth daily. Patient takes at nite, Disp: , Rfl:  .  ocrelizumab 600 mg in sodium chloride 0.9 % 500 mL, Inject 600 mg into the vein every 6 (six) months., Disp: , Rfl:  .  PRESCRIPTION MEDICATION, Reported on 05/27/2015, Disp: , Rfl:  .  rosuvastatin (CRESTOR) 5 MG tablet, Take 1 tablet by mouth 3 (three) times a week., Disp: , Rfl: 4 .  phentermine 37.5 MG capsule, Take 1 capsule (37.5 mg total) by mouth every morning., Disp: 90 capsule, Rfl: 1 No current facility-administered medications for this visit.   Facility-Administered Medications Ordered in Other Visits:  .  gadopentetate dimeglumine (MAGNEVIST) injection 20 mL, 20 mL, Intravenous, Once PRN, Dashonna Chagnon, Pearletha Furlichard A, MD  PAST MEDICAL HISTORY: Past Medical History:  Diagnosis Date  . Anxiety   . Depression   . GERD (gastroesophageal reflux disease)   . Hypertension   . Hypothyroidism   . Multiple sclerosis (HCC)   . Varicose veins     PAST SURGICAL HISTORY: Past Surgical History:  Procedure Laterality Date  . CHOLECYSTECTOMY    . ENDOVENOUS ABLATION SAPHENOUS VEIN W/ LASER Right 07-03-2015   endovenous laser ablation right greater saphenous vein and stab phlebectomy 10-20 incisions right leg by Gretta Beganodd Early MD  . ENDOVENOUS ABLATION SAPHENOUS VEIN W/ LASER Left 08-28-2015     endovenous laser ablation left greater saphenous vein and stab phlebectomy 10-20 incisions left leg  by Gretta Beganodd Early MD   . GASTRIC BYPASS    . KNEE ARTHROSCOPY Left 04/19/2013   Procedure: LEFT KNEE ARTHROSCOPY AND DEBRIDEMENT, PARTIAL MEDIAL AND LATERAL MENISCECTOMY AND MEDIAL PATELLOFEMORAL AND LATERAL CHONDROPLASTY ;  Surgeon: Shelda PalMatthew D Olin, MD;   Location: [facility];  Service: Orthopedics;  Laterality: Left;  . TONSILLECTOMY  1971    FAMILY HISTORY: Family History  Problem Relation Age of Onset  . Lupus Mother   . Diabetes Mother   . Heart disease Mother   . Heart failure Mother   . Diabetes Father   . Heart disease Father   . COPD Father   . Colon cancer Maternal Grandfather     SOCIAL HISTORY:  Social History   Socioeconomic History  . Marital status: Married    Spouse name: Not on file  . Number of children: 0  . Years of education: BS  . Highest education level: Not on file  Occupational History  . Occupation: [employer]  Social Needs  . Financial resource strain: Not on file  . Food insecurity    Worry: Not on file    Inability: Not on file  . Transportation needs    Medical: Not on file    Non-medical: Not on file  Tobacco Use  . Smoking status: Never Smoker  . Smokeless tobacco: Never Used  Substance and Sexual  Activity  . Alcohol use: No    Alcohol/week: 0.0 standard drinks  . Drug use: No  . Sexual activity: Not on file  Lifestyle  . Physical activity    Days per week: Not on file    Minutes per session: Not on file  . Stress: Not on file  Relationships  . Social Musicianconnections    Talks on phone: Not on file    Gets together: Not on file    Attends religious service: Not on file    Active member of club or organization: Not on file    Attends meetings of clubs or organizations: Not on file    Relationship status: Not on file  . Intimate partner violence    Fear of current or ex partner: Not on file    Emotionally abused: Not on file    Physically abused: Not on file    Forced sexual activity: Not on file  Other Topics Concern  . Not on file  Social History Narrative   Drinks 1 cup of tea a day      PHYSICAL EXAM  There were no vitals filed for this visit.  There is no height or weight on file to calculate BMI.   General: The patient is well-developed and well-nourished and  in no acute distress   Neurologic Exam  Mental status: The patient is alert and oriented x 3 at the time of the examination. The patient has apparent normal recent and remote memory, with an apparently normal attention span and concentration ability.   Speech is normal.  Cranial nerves: Extraocular movements are full.  Facial strength and sensation is normal.  Trapezius strength is normal..  No obvious hearing deficits are noted.  Motor:  Muscle bulk is normal.   Muscle tone is fairly normal..  The muscle strength is 5/5 in all limbs  Sensory: Sensory testing shows normal and symmetric sensation to touch and vibration in the arms and legs..  Coordination: She has good finger-nose-finger but reduced heel-to-shin bilaterally.  Gait and station: Station is normal.   Gait is arthritic but wide based   No foot drop.  The tandem gait is wide.. Romberg is negative .   Reflexes: Deep tendon reflexes are symmetric and increased bilaterally at the knees and ankles.  There is no ankle clonus.     DIAGNOSTIC DATA (LABS, IMAGING, TESTING) - I reviewed patient records, labs, notes, testing and imaging myself where available.     ASSESSMENT AND PLAN  Multiple sclerosis (HCC) - Plan: CBC with Differential/Platelets, IgG, IgA, IgM  High risk medication use - Plan: CBC with Differential/Platelets, IgG, IgA, IgM  Gait disturbance  Other fatigue  Lhermitte's sign positive   1.   Continue Ocrevus - she appears stable.   Check IgG/IgM/IgA and CBC/D   Sometime next year, we'll check MRI of the brain and cervical spine to assess for stability.    If significant progrssion, consider change to a different DMT.   2.    If more Lhermitte like spells, we'll check MRI cervical/thoracic spine to determine MS lesions or compressive myelopathic lesions 3.   Change Nuvigil to phentermine for MS fatigue and obesity.   Continue baclofen.  4.   rtc 6 months, sooner if problems  Vung Kush A. Epimenio FootSater, MD, PhD  04/04/2019, 10:28 AM Certified in Neurology, Clinical Neurophysiology, Sleep Medicine, Pain Medicine and Neuroimaging  [facility] [address], [address] [place], [place] [address] [phone]

## 2019-04-04 NOTE — Telephone Encounter (Signed)
PA for Phentermine 37.5 mg has been completed via cover my meds.  (Key: [id number])  Your information has been submitted to [facility]. To check for an updated outcome later, reopen this PA request from your dashboard.  If [employer] has not responded to your request within 24 hours, contact [facility] at [phone]. If you think there may be a problem with your PA request, use our live chat feature at the bottom right.

## 2019-04-05 LAB — CBC WITH DIFFERENTIAL/PLATELET
Basophils Absolute: 0 10*3/uL (ref 0.0–0.2)
Basos: 1 %
EOS (ABSOLUTE): 0.1 10*3/uL (ref 0.0–0.4)
Eos: 2 %
Hematocrit: 41.9 % (ref 34.0–46.6)
Hemoglobin: 13.5 g/dL (ref 11.1–15.9)
Immature Grans (Abs): 0 10*3/uL (ref 0.0–0.1)
Immature Granulocytes: 0 %
Lymphocytes Absolute: 1.5 10*3/uL (ref 0.7–3.1)
Lymphs: 26 %
MCH: 28.1 pg (ref 26.6–33.0)
MCHC: 32.2 g/dL (ref 31.5–35.7)
MCV: 87 fL (ref 79–97)
Monocytes Absolute: 0.5 10*3/uL (ref 0.1–0.9)
Monocytes: 9 %
Neutrophils Absolute: 3.7 10*3/uL (ref 1.4–7.0)
Neutrophils: 62 %
Platelets: 317 10*3/uL (ref 150–450)
RBC: 4.8 x10E6/uL (ref 3.77–5.28)
RDW: 13.2 % (ref 11.7–15.4)
WBC: 5.8 10*3/uL (ref 3.4–10.8)

## 2019-04-05 LAB — IGG, IGA, IGM
IgA/Immunoglobulin A, Serum: 121 mg/dL (ref 87–352)
IgG (Immunoglobin G), Serum: 1168 mg/dL (ref 586–1602)
IgM (Immunoglobulin M), Srm: 34 mg/dL (ref 26–217)

## 2019-04-09 ENCOUNTER — Telehealth: Payer: Self-pay | Admitting: *Deleted

## 2019-04-09 NOTE — Telephone Encounter (Signed)
Received fax notification from [facility] that PA approved 04/04/2019-07/05/2019. PA# [place] [id number] Non-grandfathered [id number]

## 2019-04-09 NOTE — Telephone Encounter (Signed)
-----   Message from Britt Bottom, MD sent at 04/05/2019 11:27 AM EST ----- Please let the patient know that the lab work is fine.

## 2019-04-09 NOTE — Telephone Encounter (Signed)
Called, LVM for pt about lab results per Dr. Felecia Shelling. Note. Gave [facility] phone number if she has further questions/concerns.

## 2019-04-10 ENCOUNTER — Telehealth: Payer: Self-pay | Admitting: *Deleted

## 2019-04-10 NOTE — Telephone Encounter (Signed)
Called, LVM letting her know we received fax from [facility] that they are needing updated consent form signed since they updated their forms. Asked her to call back so we can coordinate a time for her to come and sign form

## 2019-04-12 NOTE — Telephone Encounter (Signed)
Pt has called and states around 4pm on Monday would work best for her.

## 2019-04-12 NOTE — Telephone Encounter (Signed)
noted 

## 2019-04-17 NOTE — Telephone Encounter (Signed)
Faxed back completed/signed prescriber/pt consent form to [facility] [employer] at [phone]. Gave copy to [facility] for their records.

## 2019-05-09 ENCOUNTER — Other Ambulatory Visit: Payer: Self-pay | Admitting: Neurology

## 2019-07-17 ENCOUNTER — Telehealth: Payer: Self-pay | Admitting: *Deleted

## 2019-07-17 NOTE — Telephone Encounter (Addendum)
Submitted PA phentermine on CMM. [id number]. Determination pending with [facility]. I called pt and LVM to let her know. Right now it will be about 37/38 dollars/90days for rx. Asked her to call back if she has any further questions/concerns.

## 2019-07-17 NOTE — Telephone Encounter (Signed)
Received fax from [facility] that PA denied. Per MD, pt can use goodrx coupon to fill.

## 2019-08-22 ENCOUNTER — Other Ambulatory Visit: Payer: Self-pay | Admitting: Neurology

## 2019-10-02 ENCOUNTER — Other Ambulatory Visit: Payer: Self-pay

## 2019-10-02 ENCOUNTER — Ambulatory Visit: Payer: BC Managed Care – PPO | Admitting: Family Medicine

## 2019-10-02 ENCOUNTER — Encounter: Payer: Self-pay | Admitting: Family Medicine

## 2019-10-02 VITALS — BP 141/84 | HR 68 | Ht 61.0 in | Wt 244.0 lb

## 2019-10-02 DIAGNOSIS — R5383 Other fatigue: Secondary | ICD-10-CM

## 2019-10-02 DIAGNOSIS — Z79899 Other long term (current) drug therapy: Secondary | ICD-10-CM | POA: Diagnosis not present

## 2019-10-02 DIAGNOSIS — G35 Multiple sclerosis: Secondary | ICD-10-CM | POA: Diagnosis not present

## 2019-10-02 DIAGNOSIS — E559 Vitamin D deficiency, unspecified: Secondary | ICD-10-CM | POA: Diagnosis not present

## 2019-10-02 NOTE — Patient Instructions (Signed)
We will continue current treatment plan. We will update labs today. Will order MRI for monitoring.   Stay well hydrated. Well balanced diet and regular exercise.   Follow up with Dr Epimenio Foot   Multiple Sclerosis Multiple sclerosis (MS) is a disease of the brain, spinal cord, and optic nerves (central nervous system). It causes the body's disease-fighting (immune) system to destroy the protective covering (myelin sheath) around nerves in the brain. When this happens, signals (nerve impulses) going to and from the brain and spinal cord do not get sent properly or may not get sent at all. There are several types of MS:  Relapsing-remitting MS. This is the most common type. This causes sudden attacks of symptoms. After an attack, you may recover completely until the next attack, or some symptoms may remain permanently.  Secondary progressive MS. This usually develops after the onset of relapsing-remitting MS. Similar to relapsing-remitting MS, this type also causes sudden attacks of symptoms. Attacks may be less frequent, but symptoms slowly get worse (progress) over time.  Primary progressive MS. This causes symptoms that steadily progress over time. This type of MS does not cause sudden attacks of symptoms. The age of onset of MS varies, but it often develops between 85-78 years of age. MS is a lifelong (chronic) condition. There is no cure, but treatment can help slow down the progression of the disease. What are the causes? The cause of this condition is not known. What increases the risk? You are more likely to develop this condition if:  You are a woman.  You have a relative with MS. However, the condition is not passed from parent to child (inherited).  You have a lack (deficiency) of vitamin D.  You smoke. MS is more common in the [place] than in the [place]. What are the signs or symptoms? Relapsing-remitting and secondary progressive MS cause symptoms to  occur in episodes or attacks that may last weeks to months. There may be long periods between attacks in which there are almost no symptoms. Primary progressive MS causes symptoms to steadily progress after they develop. Symptoms of MS vary because of the many different ways it affects the central nervous system. The main symptoms include:  Vision problems and eye pain.  Numbness.  Weakness.  Inability to move your arms, hands, feet, or legs (paralysis).  Balance problems.  Shaking that you cannot control (tremors).  Muscle spasms.  Problems with thinking (cognitive changes). MS can also cause symptoms that are associated with the disease, but are not always the direct result of an MS attack. They may include:  Inability to control urination or bowel movements (incontinence).  Headaches.  Fatigue.  Inability to tolerate heat.  Emotional changes.  Depression.  Pain. How is this diagnosed? This condition is diagnosed based on:  Your symptoms.  A neurological exam. This involves checking central nervous system function, such as nerve function, reflexes, and coordination.  MRIs of the brain and spinal cord.  Lab tests, including a lumbar puncture that tests the fluid that surrounds the brain and spinal cord (cerebrospinal fluid).  Tests to measure the electrical activity of the brain in response to stimulation (evoked potentials). How is this treated? There is no cure for MS, but medicines can help decrease the number and frequency of attacks and help relieve nuisance symptoms. Treatment options may include:  Medicines that reduce the frequency of attacks. These medicines may be given by injection, by mouth (orally), or through an IV.  Medicines that reduce inflammation (steroids). These may provide short-term relief of symptoms.  Medicines to help control pain, depression, fatigue, or incontinence.  Vitamin D, if you have a deficiency.  Using devices to help you  move around (assistive devices), such as braces, a cane, or a walker.  Physical therapy to strengthen and stretch your muscles.  Occupational therapy to help you with everyday tasks.  Alternative or complementary treatments such as exercise, massage, or acupuncture. Follow these instructions at home:  Take over-the-counter and prescription medicines only as told by your health care provider.  Do not drive or use heavy machinery while taking prescription pain medicine.  Use assistive devices as recommended by your physical therapist or your health care provider.  Exercise as directed by your health care provider.  Return to your normal activities as told by your health care provider. Ask your health care provider what activities are safe for you.  Reach out for support. Share your feelings with friends, family, or a support group.  Keep all follow-up visits as told by your health care provider and therapists. This is important. Where to find more information  National Multiple Sclerosis Society: https://www.nationalmssociety.org Contact a health care provider if:  You feel depressed.  You develop new pain or numbness.  You have tremors.  You have problems with sexual function. Get help right away if:  You develop paralysis.  You develop numbness.  You have problems with your bladder or bowel function.  You develop double vision.  You lose vision in one or both eyes.  You develop suicidal thoughts.  You develop severe confusion. If you ever feel like you may hurt yourself or others, or have thoughts about taking your own life, get help right away. You can go to your nearest emergency department or call:  Your local emergency services (911 in the [place]  A suicide crisis helpline, such as the [facility] at 989-586-4492. This is open 24 hours a day. Summary  Multiple sclerosis (MS) is a disease of the central nervous system that causes  the body's immune system to destroy the protective covering (myelin sheath) around nerves in the brain.  There are 3 types of MS: relapsing-remitting, secondary progressive, and primary progressive. Relapsing-remitting and secondary progressive MS cause symptoms to occur in episodes or attacks that may last weeks to months. Primary progressive MS causes symptoms to steadily progress after they develop.  There is no cure for MS, but medicines can help decrease the number and frequency of attacks and help relieve nuisance symptoms. Treatment may also include physical or occupational therapy.  If you develop numbness, paralysis, vision problems, or other neurological symptoms, get help right away. This information is not intended to replace advice given to you by your health care provider. Make sure you discuss any questions you have with your health care provider. Document Revised: 04/08/2017 Document Reviewed: 07/05/2016 [employer] Patient Education  2020 [employer].

## 2019-10-02 NOTE — Progress Notes (Signed)
PATIENT: Angela Coffey DOB: 1961/01/24  REASON FOR VISIT: follow up HISTORY FROM: patient  Chief Complaint  Patient presents with  . Follow-up    Rm 2 here for a f/u on MS. Pt is having no new sx     HISTORY OF PRESENT ILLNESS: Today 10/02/19 Angela Coffey is a 59 y.o. 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 Last infusion in April. She reports doing well. She denies new or worsening symptoms. She does not progressive hearing loss over the past few years.   She feels that phentermine helps with MS fatigue more so than armodafinil. She feels that baclofen helps with muscle spasms. She usually takes one dose several times a week. She continues vitamin D rx, managed by PCP. Recent vitamin D was 64 in 05/2019.   No changes in gait, bowel or bladder function. No vision changes. She is very active. 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   HISTORY: (copied from Dr Garth Bigness note on 04/04/2019)  Angela Coffey is a 59 y.o. woman with relapsing remitting multiple sclerosis.  Update 04/04/2019: She is on Ocrevus for relapsing remitting MS.    She tolerates Ocrevus well.   Hr last infusion was early October.  Last MRI was 04/06/2017 and it did not show any new lesions.   Blood work 03/23/2017 showed normal IgG/IgA/IgM and CBC with differential.     She reports two episodes of 'electric shock' in the spine lasting just a second but intense.  She was standing at the time and the shock occurred in the lower thoracic/upper lumbar region.   She had not had these since before diagnosis.    She notes no new weakness, numbness, gait or bladder.  Her right leg has mild stable weakness and spasticity.   Baclofen helps.  The MRI of the cervical spine 02/28/2016 showed 3 within the spinal cord, midline and just left of midline at C2-C3, left posteriorly at C4-C5 and right posteriorly at C6-C7.   Vision is fine  She has some fatigue but it is not limiting her most days but she feels less  apathetic on days off work than she used to be.   Nuvigil has helped  She has depression helped by buproprion and Lexapro.     Update 09/26/18 (virtual) She is on Ocrevus and tolerates it well (no infusion issues or infections).   Her last infusion was in April 2020,  next infusion is in October.  IgG and IgM were fine when last checked 6 months ago.     Gait is doing well.   She can go up/down stairs easily but holds the bannister.   The right leg is a little weaker than the left and feels heavier when she is more tired.   She denies numbness or tingling.   Bladder is doing well.   Vision has done well.  She has some fatigue, worse the month before her last infusion. She takes Nuvigil prn.    She is sleeping well.   She was getting more aggravated easily but this improved with thyroid medication changes.   Cognition is good though she sometimes has word finding issues if tired.    She is taking Vit D 50,000 Units twice a week due to low levels.   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     Update 03/28/2018: She feels her MS has been stable.  She denies any exacerbations.  She is on Ocrevus  and she is tolerating it well.  Last infusion was a year ago.  Last MRI was 04/06/2017 and it did not show any new lesions.   Blood work 03/23/2017 showed normal IgG/IgA/IgM and CBC with differential.  MS symptoms are stable.  Gait is reduced due to to poor balance more than weakness. Knee arthritis also affects gait and she gets some knee injections.   She has fallen once in past year.   However, the right leg does give out at times.  She denies any numbness or dysesthesias.  Bladder function is doing well.  She denies any new visual problems.  She saw ophthalmology recently and no new issues.  She has mild fatigue but it is not preventing her from doing activities that she needs to do.  She sleeps well most nights.  Mood has done now since Pristiq was added.   She was having some agitation.     She denies any significant cognitive dysfunction.   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      Update 09/21/2017: She is tolerating the ocrelizumab infusions well.   She denies any exacerbations.     Her last infusion was 2 months ago.    MRI 04/06/17 showed no new lesions.    IgG/IgA/IgM were fine.    Balance is mildly off and she has some stumbles and a couple falls (last one dog pulled her).  Legs seem strong but the right leg will give out or she notes a foot drop if tired.   Bladder is doing well.   No numbness.     She has had some fatigue and noted more for a few days after the infusion.   There is only  A little fatigue now.    She is sleeping well.  She notes no cognitive issues..     Mood is doing well.     Update 03/23/2017:   She had her last Ocrevus infusion in September.   She has tolerated it well.   She feels mostly stable and has no exacerbation since starting.   Her balance is a little off, maybe mildly worse than last visit. However, walking endurance is doing better since starting Ocrevus.    She denies any new numbness, weakness.      She denies any change in vision.   No change in bladder.    This summer, she had a lot more fatigue but is doing better since starting Ocrevus earlier this year.  She had previously been on Rebif.    She is sleeping 7 hours a night.   She falls asleep easily and wakes up a few times but falls right back asleep.   She has 1 x nocturia.     Cognitively, she is doing well.    Mood is doing well.   She takes Vit D 50000 U twice a week as level still low on once a week.    From 09/08/2016:  MS:   She had her first course of ocrelizumab in March 2018. She tolerated the infusions well and there were no complications. She had no infusion reactions at all. Since then, she feels stronger and less fatigue in general.\  Gait/strength/sensation:    She feels her gait is  doing better since starting ocrelizumab.    She walked 20 minutes without stopping about a mile which is the most she has walked in several years.  She still staggers slightly though she feels this is mild.   She has one fall hitting her shoulder when she tried to get dressed standing up.   Leg spasticity is better.,   She is using less baclofen.     Vision:   She denies any MS related problem with her vision.   She has no history of optic neuritis or diplopia.  Bladder/bowel:  She denies any significant problem with bladder or bowel function.  Fatigue/sleep: She notes a little less fatigue on Ocrelizumab than Rebif.  She reports fatigue that is helped by Nuvigil 200 mg daily.   She generally sleeps well at night.  Mood/cognition:    She notes less depression and anxiety. She remains on escitalopram.  She notes mild cognition changes but these are better on ocrelizumab. She has noted some difficulty with word finding and attention and focus.    She takes Vit D 50,000 weekly.  Her maternal grandfather had MS and her mother had SLE.    MS history:   In 2008, she had the onset of numbness in the left arm. When symptoms persisted for a few weeks she was referred to Dr. Adella Hare. Nerve conduction and EMG study was normal so an MRI of the cervical spine was ordered which was abnormal showing foci consistent with MS. An MRI of the brain showed classic MS lesions and she was diagnosed with MS. She was started on Rebif. She had some injection reactions her first year but generally has tolerated it well.  A couple years ago, she saw Dr. Leona Carry in [place] and Ashok Cordia was tried. However, she did not feel good when she took Aubagio and went back on Rebif. She has had a couple of sensory exacerbations and received some IV steroids.   She had her first course of ocrelizumab in March 2018.   I have reviewed the MRI of the brain and cervical spine from 03/03/2016 and the MRI of the brain from 07/10/2014.  The MRI of the brain shows classic periventricular, juxtacortical and deep white matter foci in a pattern and configuration consistent with MS. There are no new lesions and no changes compared to the 2016 MRI. The MRI of the cervical spine showed 3 within the spinal cord, midline and just left of midline at C2-C3, left posteriorly at C4-C5 and right posteriorly at C6-C7. None of the foci appeared to be acute.   REVIEW OF SYSTEMS: Out of a complete 14 system review of symptoms, the patient complains only of the following symptoms, hearing loss, fatigue, muscle spasms and all other reviewed systems are negative.  ALLERGIES: No Known Allergies  HOME MEDICATIONS: Outpatient Medications Prior to Visit  Medication Sig Dispense Refill  . acetaminophen (TYLENOL) 500 MG tablet Take 500 mg by mouth every 6 (six) hours as needed. Patient takes 2 tablets when she takes Rebif    . ALPRAZolam (XANAX) 0.25 MG tablet Take 0.25 mg by mouth as needed.   0  . Armodafinil 200 MG TABS TAKE 1 TABLET BY MOUTH DAILY 30 tablet 5  . baclofen (LIORESAL) 10 MG tablet TAKE 1 TABLET BY MOUTH THREE TIMES DAILY 270 tablet 1  . buPROPion (WELLBUTRIN XL) 150 MG 24 hr tablet Take 300 mg by mouth daily. Patient takes in the am (450mg )    . cholecalciferol (VITAMIN D) 1000 UNITS tablet Take 50,000 Units by mouth 2 (two) times a week.     Desvenlafaxine Succinate ER (PRISTIQ) 25 MG TB24 Take 1 tablet  by mouth daily.    Marland Kitchen escitalopram (LEXAPRO) 10 MG tablet Take 10 mg by mouth daily.    Marland Kitchen esomeprazole (NEXIUM) 20 MG capsule Take 20 mg by mouth 2 (two) times daily.    . Ibuprofen (MOTRIN PO) Take by mouth. Patient takes  as needed    . levothyroxine (SYNTHROID, LEVOTHROID) 200 MCG tablet Take 200 mcg by mouth daily before breakfast. Takes daily - 7days a week    . nebivolol (BYSTOLIC) 5 MG tablet Take 5 mg by mouth daily. Patient takes at nite    . ocrelizumab 600 mg in sodium chloride 0.9 % 500 mL Inject 600 mg into the vein  every 6 (six) months.    . phentermine 37.5 MG capsule Take 1 capsule (37.5 mg total) by mouth every morning. 90 capsule 1  . PRESCRIPTION MEDICATION Reported on 05/27/2015    . rosuvastatin (CRESTOR) 5 MG tablet Take 1 tablet by mouth 3 (three) times a week.  4   Facility-Administered Medications Prior to Visit  Medication Dose Route Frequency Provider Last Rate Last Admin  . gadopentetate dimeglumine (MAGNEVIST) injection 20 mL  20 mL Intravenous Once PRN Sater, Pearletha Furl, MD        PAST MEDICAL HISTORY: Past Medical History:  Diagnosis Date  . Anxiety   . Depression   . GERD (gastroesophageal reflux disease)   . Hypertension   . Hypothyroidism   . Multiple sclerosis (HCC)   . Varicose veins     PAST SURGICAL HISTORY: Past Surgical History:  Procedure Laterality Date  . CHOLECYSTECTOMY    . ENDOVENOUS ABLATION SAPHENOUS VEIN W/ LASER Right 07-03-2015   endovenous laser ablation right greater saphenous vein and stab phlebectomy 10-20 incisions right leg by Gretta Began MD  . ENDOVENOUS ABLATION SAPHENOUS VEIN W/ LASER Left 08-28-2015     endovenous laser ablation left greater saphenous vein and stab phlebectomy 10-20 incisions left leg  by Gretta Began MD   . GASTRIC BYPASS    . KNEE ARTHROSCOPY Left 04/19/2013   Procedure: LEFT KNEE ARTHROSCOPY AND DEBRIDEMENT, PARTIAL MEDIAL AND LATERAL MENISCECTOMY AND MEDIAL PATELLOFEMORAL AND LATERAL CHONDROPLASTY ;  Surgeon: Shelda Pal, MD;  Location: [facility];  Service: Orthopedics;  Laterality: Left;  . TONSILLECTOMY  1971    FAMILY HISTORY: Family History  Problem Relation Age of Onset  . Lupus Mother   . Diabetes Mother   . Heart disease Mother   . Heart failure Mother   . Diabetes Father   . Heart disease Father   . COPD Father   . Colon cancer Maternal Grandfather     SOCIAL HISTORY: Social History   Socioeconomic History  . Marital status: Married    Spouse name: Not on file  . Number of children: 0  . Years of  education: BS  . Highest education level: Not on file  Occupational History  . Occupation: [employer]  Tobacco Use  . Smoking status: Never Smoker  . Smokeless tobacco: Never Used  Substance and Sexual Activity  . Alcohol use: No    Alcohol/week: 0.0 standard drinks  . Drug use: No  . Sexual activity: Not on file  Other Topics Concern  . Not on file  Social History Narrative   Drinks 1 cup of tea a day    Social Determinants of Health   Financial Resource Strain:   . Difficulty of Paying Living Expenses:   Food Insecurity:   . Worried About Programme researcher, broadcasting/film/video in  the Last Year:   . Ran Out of Food in the Last Year:   Transportation Needs:   . Freight forwarder (Medical):   Marland Kitchen Lack of Transportation (Non-Medical):   Physical Activity:   . Days of Exercise per Week:   . Minutes of Exercise per Session:   Stress:   . Feeling of Stress :   Social Connections:   . Frequency of Communication with Friends and Family:   . Frequency of Social Gatherings with Friends and Family:   . Attends Religious Services:   . Active Member of Clubs or Organizations:   . Attends Banker Meetings:   Marland Kitchen Marital Status:   Intimate Partner Violence:   . Fear of Current or Ex-Partner:   . Emotionally Abused:   Marland Kitchen Physically Abused:   . Sexually Abused:       PHYSICAL EXAM  Vitals:   10/02/19 1537  BP: (!) 141/84  Pulse: 68  Weight: 244 lb (110.7 kg)  Height: 5\' 1"  (1.549 m)   Body mass index is 46.1 kg/m.  Generalized: Well developed, in no acute distress  Cardiology: normal rate and rhythm, no murmur noted Respiratory: clear to auscultation bilaterally  Neurological examination  Mentation: Alert oriented to time, place, history taking. Follows all commands speech and language fluent Cranial nerve II-XII: Pupils were equal round reactive to light. Extraocular movements were full, visual field were full on confrontational test. Facial sensation and strength  were normal. Uvula tongue midline. Head turning and shoulder shrug  were normal and symmetric. Motor: The motor testing reveals 5 over 5 strength of all 4 extremities. Good symmetric motor tone is noted throughout.  Sensory: Sensory testing is intact to soft touch on all 4 extremities. No evidence of extinction is noted.  Coordination: Cerebellar testing reveals good finger-nose-finger and heel-to-shin bilaterally.  Gait and station: wide, arthritic gait  DIAGNOSTIC DATA (LABS, IMAGING, TESTING) - I reviewed patient records, labs, notes, testing and imaging myself where available.  No flowsheet data found.   Lab Results  Component Value Date   WBC 5.8 04/04/2019   HGB 13.5 04/04/2019   HCT 41.9 04/04/2019   MCV 87 04/04/2019   PLT 317 04/04/2019      Component Value Date/Time   NA 137 04/19/2013 0600   K 4.0 04/19/2013 0600   CL 102 04/19/2013 0600   CO2 27 04/19/2013 0600   GLUCOSE 93 04/19/2013 0600   BUN 13 04/19/2013 0600   CREATININE 0.94 04/19/2013 0600   CALCIUM 9.2 04/19/2013 0600   PROT 7.2 05/05/2016 1432   ALBUMIN 4.2 05/05/2016 1432   AST 14 05/05/2016 1432   ALT 17 05/05/2016 1432   ALKPHOS 142 (H) 05/05/2016 1432   BILITOT 0.3 05/05/2016 1432   GFRNONAA 69 (L) 04/19/2013 0600   GFRAA 80 (L) 04/19/2013 0600   No results found for: CHOL, HDL, LDLCALC, LDLDIRECT, TRIG, CHOLHDL No results found for: 14/03/2013 No results found for: VITAMINB12 No results found for: TSH     ASSESSMENT AND PLAN 59 y.o. year old female  has a past medical history of Anxiety, Depression, GERD (gastroesophageal reflux disease), Hypertension, Hypothyroidism, Multiple sclerosis (HCC), and Varicose veins. here with     ICD-10-CM   1. Relapsing remitting multiple sclerosis (HCC)  G35 MR BRAIN W WO CONTRAST    CBC with Differential/Platelets    IgG, IgA, IgM    CMP  2. High risk medication use  Z79.899 CBC with Differential/Platelets    IgG, IgA,  IgM    CMP  3. Vitamin D deficiency   E55.9   4. Other fatigue  R53.83     Since he is doing very well on Ocrevus infusions every 6 months.  We will continue current treatment plan.  She will continue phentermine for MS fatigue.  May continue baclofen as needed for muscle spasms. We will update labs today. Will order MRI brain for evaluation. Last Mri stable in 2018. She was encouraged to follow up closely with PCP for any worsening of hearing loss. She has had audiological testing years ago that did show early hearing loss. May be helpful to repeat testing. She was encouraged to stay active. Well balanced diet and regular exercise encouraged. She will follow up in 6 months, sooner if needed. She verbalizes understanding and agreement with this plan.    Orders Placed This Encounter  Procedures  . MR BRAIN W WO CONTRAST    MS protocol, Dr Bonnita Hollow patient    Standing Status:   Future    Standing Expiration Date:   10/01/2020    Order Specific Question:   If indicated for the ordered procedure, I authorize the administration of contrast media per Radiology protocol    Answer:   Yes    Order Specific Question:   What is the patient's sedation requirement?    Answer:   No Sedation    Order Specific Question:   Does the patient have a pacemaker or implanted devices?    Answer:   No    Order Specific Question:   Radiology Contrast Protocol - do NOT remove file path    Answer:   [email or web address]    Order Specific Question:   Preferred imaging location?    Answer:   Internal  . CBC with Differential/Platelets  . IgG, IgA, IgM  . CMP     No orders of the defined types were placed in this encounter.     I spent 30 minutes with the patient. 50% of this time was spent counseling and educating patient on plan of care and medications.    Shawnie Dapper, FNP-C 10/02/2019, 4:28 PM [facility] [address], [address] [place], [place] [address] [phone]

## 2019-10-02 NOTE — Progress Notes (Signed)
I have read the note, and I agree with the clinical assessment and plan.  Io Dieujuste A. Valerya Maxton, MD, PhD, FAAN Certified in Neurology, Clinical Neurophysiology, Sleep Medicine, Pain Medicine and Neuroimaging  [facility] [address], [address] , [place] [address] [phone]  

## 2019-10-02 NOTE — Progress Notes (Signed)
I have read the note, and I agree with the clinical assessment and plan.  Mandy Peeks A. Deshae Dickison, MD, PhD, FAAN Certified in Neurology, Clinical Neurophysiology, Sleep Medicine, Pain Medicine and Neuroimaging  [facility] [address], [address] [place], [place] [address] [phone]  

## 2019-10-03 ENCOUNTER — Telehealth: Payer: Self-pay

## 2019-10-03 LAB — COMPREHENSIVE METABOLIC PANEL
ALT: 10 IU/L (ref 0–32)
AST: 12 IU/L (ref 0–40)
Albumin/Globulin Ratio: 2 (ref 1.2–2.2)
Albumin: 4.3 g/dL (ref 3.8–4.9)
Alkaline Phosphatase: 116 IU/L (ref 48–121)
BUN/Creatinine Ratio: 15 (ref 9–23)
BUN: 13 mg/dL (ref 6–24)
Bilirubin Total: 0.4 mg/dL (ref 0.0–1.2)
CO2: 21 mmol/L (ref 20–29)
Calcium: 9.2 mg/dL (ref 8.7–10.2)
Chloride: 104 mmol/L (ref 96–106)
Creatinine, Ser: 0.85 mg/dL (ref 0.57–1.00)
GFR calc Af Amer: 88 mL/min/{1.73_m2} (ref >59)
GFR calc non Af Amer: 76 mL/min/{1.73_m2} (ref >59)
Globulin, Total: 2.1 g/dL (ref 1.5–4.5)
Glucose: 78 mg/dL (ref 65–99)
Potassium: 4.6 mmol/L (ref 3.5–5.2)
Sodium: 141 mmol/L (ref 134–144)
Total Protein: 6.4 g/dL (ref 6.0–8.5)

## 2019-10-03 LAB — CBC WITH DIFFERENTIAL/PLATELET
Basophils Absolute: 0 10*3/uL (ref 0.0–0.2)
Basos: 0 %
EOS (ABSOLUTE): 0.2 10*3/uL (ref 0.0–0.4)
Eos: 2 %
Hematocrit: 41.9 % (ref 34.0–46.6)
Hemoglobin: 14 g/dL (ref 11.1–15.9)
Immature Grans (Abs): 0 10*3/uL (ref 0.0–0.1)
Immature Granulocytes: 0 %
Lymphocytes Absolute: 2.1 10*3/uL (ref 0.7–3.1)
Lymphs: 29 %
MCH: 30 pg (ref 26.6–33.0)
MCHC: 33.4 g/dL (ref 31.5–35.7)
MCV: 90 fL (ref 79–97)
Monocytes Absolute: 0.7 10*3/uL (ref 0.1–0.9)
Monocytes: 10 %
Neutrophils Absolute: 4.2 10*3/uL (ref 1.4–7.0)
Neutrophils: 59 %
Platelets: 322 10*3/uL (ref 150–450)
RBC: 4.67 x10E6/uL (ref 3.77–5.28)
RDW: 12.8 % (ref 11.7–15.4)
WBC: 7.2 10*3/uL (ref 3.4–10.8)

## 2019-10-03 LAB — IGG, IGA, IGM
IgA/Immunoglobulin A, Serum: 104 mg/dL (ref 87–352)
IgG (Immunoglobin G), Serum: 1108 mg/dL (ref 586–1602)
IgM (Immunoglobulin M), Srm: 27 mg/dL (ref 26–217)

## 2019-10-03 NOTE — Telephone Encounter (Addendum)
Attempted to call the pt but she was at work according to her husband. He said to call her on her cell phone after 3:30 pm.  ----- Message from Angela Dapper, NP sent at 10/03/2019  7:24 AM EDT ----- Labs look great. We will see her back in 6 months

## 2019-10-04 ENCOUNTER — Telehealth: Payer: Self-pay

## 2019-10-04 NOTE — Telephone Encounter (Addendum)
Attempt to call the patient was unsuccessful. LM on the VM for the patient to call back. I  ** If the patient calls back please relay the message below to her**  ----- Message from Shawnie Dapper, NP sent at 10/03/2019  7:24 AM EDT ----- Labs look great. We will see her back in 6 months

## 2019-10-09 NOTE — Telephone Encounter (Signed)
Pt has called and the message from CMA was relayed to pt, no questions or a request for a call back

## 2019-10-11 ENCOUNTER — Telehealth: Payer: Self-pay | Admitting: Family Medicine

## 2019-10-11 NOTE — Telephone Encounter (Signed)
LVM for pt to call back about scheduling mri  BCBS Auth: [id number] (exp. 10/05/19 to 04/01/20)

## 2019-10-26 ENCOUNTER — Other Ambulatory Visit: Payer: Self-pay | Admitting: Neurology

## 2019-10-30 ENCOUNTER — Telehealth: Payer: Self-pay

## 2019-10-30 NOTE — Telephone Encounter (Signed)
Received fax from [employer] that PA denied.  "Phentermine/Phendimetrazine/Didrex/Diethylpropion plan covers this drug when you have not received 3 months of therapy with the requested drug within the past year. Your request has been denied based on the information we have"    Per MD, pt can use goodrx coupon to fill. LVM To inform pt

## 2019-10-30 NOTE — Telephone Encounter (Signed)
A PA has been sent via CMM for Phentermine HCl 37.5MG  capsules  Key: [id number] - PA Case ID: [id number] - Rx #: [id number]

## 2020-01-19 ENCOUNTER — Other Ambulatory Visit: Payer: Self-pay | Admitting: Adult Health

## 2020-01-19 NOTE — Progress Notes (Signed)
I connected by phone with Tracie Harrier on 01/19/2020 at 3:50 PM to discuss the potential use of a new treatment for mild to moderate COVID-19 viral infection in non-hospitalized patients.  This patient is a 59 y.o. female that meets the FDA criteria for Emergency Use Authorization of COVID monoclonal antibody casirivimab/imdevimab.  Has a (+) direct SARS-CoV-2 viral test result  Has mild or moderate COVID-19   Is NOT hospitalized due to COVID-19  Is within 10 days of symptom onset  Has at least one of the high risk factor(s) for progression to severe COVID-19 and/or hospitalization as defined in EUA.  Specific high risk criteria : BMI > 25   I have spoken and communicated the following to the patient or parent/caregiver regarding COVID monoclonal antibody treatment:  1. FDA has authorized the emergency use for the treatment of mild to moderate COVID-19 in adults and pediatric patients with positive results of direct SARS-CoV-2 viral testing who are 68 years of age and older weighing at least 40 kg, and who are at high risk for progressing to severe COVID-19 and/or hospitalization.  2. The significant known and potential risks and benefits of COVID monoclonal antibody, and the extent to which such potential risks and benefits are unknown.  3. Information on available alternative treatments and the risks and benefits of those alternatives, including clinical trials.  4. Patients treated with COVID monoclonal antibody should continue to self-isolate and use infection control measures (e.g., wear mask, isolate, social distance, avoid sharing personal items, clean and disinfect "high touch" surfaces, and frequent handwashing) according to CDC guidelines.   5. The patient or parent/caregiver has the option to accept or refuse COVID monoclonal antibody treatment.  After reviewing this information with the patient, The patient agreed to proceed with receiving casirivimab\imdevimab infusion and  will be provided a copy of the Fact sheet prior to receiving the infusion.  Set up for 01/20/20 at 1600 . Will bring copy of test .  Angela Coffey 01/19/2020 3:50 PM

## 2020-01-20 ENCOUNTER — Other Ambulatory Visit (HOSPITAL_COMMUNITY): Payer: Self-pay

## 2020-01-20 ENCOUNTER — Ambulatory Visit (HOSPITAL_COMMUNITY)
Admission: RE | Admit: 2020-01-20 | Discharge: 2020-01-20 | Disposition: A | Discharge status: Home / Self Care | Payer: BC Managed Care – PPO | Source: Ambulatory Visit | Attending: Pulmonary Disease | Admitting: Pulmonary Disease

## 2020-01-20 DIAGNOSIS — U071 COVID-19: Secondary | ICD-10-CM | POA: Diagnosis not present

## 2020-01-20 MED ORDER — FAMOTIDINE IN NACL 20-0.9 MG/50ML-% IV SOLN: 20.0000 mg | Freq: Once | INTRAVENOUS | Status: DC | PRN

## 2020-01-20 MED ORDER — SODIUM CHLORIDE 0.9 % IV SOLN
1200.0000 mg | Freq: Once | INTRAVENOUS | Status: AC
  Administered 2020-01-20: 1200 mg via INTRAVENOUS
  Filled 2020-01-20: qty 10

## 2020-01-20 MED ORDER — SODIUM CHLORIDE 0.9 % IV SOLN: INTRAVENOUS | Status: DC | PRN

## 2020-01-20 MED ORDER — EPINEPHRINE 0.3 MG/0.3ML IJ SOAJ: 0.3000 mg | Freq: Once | INTRAMUSCULAR | Status: DC | PRN

## 2020-01-20 MED ORDER — METHYLPREDNISOLONE SODIUM SUCC 125 MG IJ SOLR: 125.0000 mg | Freq: Once | INTRAMUSCULAR | Status: DC | PRN

## 2020-01-20 MED ORDER — ALBUTEROL SULFATE HFA 108 (90 BASE) MCG/ACT IN AERS: 2.0000 | INHALATION_SPRAY | Freq: Once | RESPIRATORY_TRACT | Status: DC | PRN

## 2020-01-20 MED ORDER — DIPHENHYDRAMINE HCL 50 MG/ML IJ SOLN: 50.0000 mg | Freq: Once | INTRAMUSCULAR | Status: DC | PRN

## 2020-01-20 NOTE — Progress Notes (Signed)
  Diagnosis: COVID-19  Physician: Wright    Procedure: [facility] Med: casirivimab\imdevimab infusion - Provided patient with casirivimab\imdevimab fact sheet for patients, parents and caregivers prior to infusion.  Complications: No immediate complications noted.  Discharge: Discharged home   Angela Coffey 01/20/2020   

## 2020-01-20 NOTE — Discharge Instructions (Signed)

## 2020-02-13 ENCOUNTER — Telehealth: Payer: Self-pay | Admitting: Family Medicine

## 2020-02-13 NOTE — Telephone Encounter (Signed)
Pt called, phentermine 37.5 MG capsule is not working, does not help with fatigue. Would like to switch to Nuvigil. Would like a call from the nurse.

## 2020-02-14 NOTE — Telephone Encounter (Signed)
I LMVM for pt that returned call.  

## 2020-02-14 NOTE — Telephone Encounter (Signed)
I called and LMVM for pt that returned call.  She had been on armodafinil previously.

## 2020-02-18 MED ORDER — MODAFINIL 100 MG PO TABS: 100.0000 mg | ORAL_TABLET | Freq: Every day | ORAL | 3 refills | Status: DC

## 2020-02-18 NOTE — Telephone Encounter (Signed)
LMVM for pt home # that returning her call.

## 2020-02-18 NOTE — Addendum Note (Signed)
Addended by: Shawnie Dapper L on: 02/18/2020 10:32 AM   Modules accepted: Orders

## 2020-02-19 NOTE — Telephone Encounter (Signed)
Attempted to call pt,  Please schedule appt with Dr. Epimenio Foot

## 2020-02-21 ENCOUNTER — Telehealth: Payer: Self-pay | Admitting: *Deleted

## 2020-02-21 NOTE — Telephone Encounter (Signed)
Modafinil PA, key: [id number], G35, R53.83. Your information has been submitted to [employer]. To check for an updated outcome later, reopen this PA request from your dashboard. If [employer] has not responded to your request within 24 hours, contact [employer] at [phone]

## 2020-02-25 NOTE — Telephone Encounter (Signed)
[facility] approved Modafinil 02/21/20 - 02/20/21. Approval letter faxed to pharmacy.

## 2020-06-12 ENCOUNTER — Ambulatory Visit: Payer: BC Managed Care – PPO | Admitting: Neurology

## 2020-06-12 ENCOUNTER — Telehealth: Payer: Self-pay | Admitting: *Deleted

## 2020-06-12 ENCOUNTER — Encounter: Payer: Self-pay | Admitting: Neurology

## 2020-06-12 VITALS — BP 144/79 | HR 66 | Ht 61.0 in | Wt 255.0 lb

## 2020-06-12 DIAGNOSIS — R269 Unspecified abnormalities of gait and mobility: Secondary | ICD-10-CM

## 2020-06-12 DIAGNOSIS — G35 Multiple sclerosis: Secondary | ICD-10-CM

## 2020-06-12 DIAGNOSIS — R4789 Other speech disturbances: Secondary | ICD-10-CM

## 2020-06-12 DIAGNOSIS — R5383 Other fatigue: Secondary | ICD-10-CM | POA: Diagnosis not present

## 2020-06-12 DIAGNOSIS — F418 Other specified anxiety disorders: Secondary | ICD-10-CM

## 2020-06-12 DIAGNOSIS — Z79899 Other long term (current) drug therapy: Secondary | ICD-10-CM | POA: Diagnosis not present

## 2020-06-12 MED ORDER — ARMODAFINIL 200 MG PO TABS
1.0000 | ORAL_TABLET | Freq: Every day | ORAL | 5 refills | Status: DC
Start: 2020-06-12 — End: 2020-12-11

## 2020-06-12 NOTE — Telephone Encounter (Signed)
Submitted PA armodafinil on CMM. Key: [id number]. Waiting on determination from [facility].

## 2020-06-12 NOTE — Progress Notes (Signed)
[facility]  PATIENT: Angela Coffey DOB: 08-31-1960  REFERRING DOCTOR OR PCP:  Philemon Kingdom (PCP); Quinn Plowman (Neurology) SOURCE: patient, notes from PCP and [facility]; MRI images on PACS, Lab results  _________________________________   HISTORICAL  CHIEF COMPLAINT:  Chief Complaint  Patient presents with  . Follow-up    RM 13, alone. Last seen 10/02/2019 by AL,NP. On Ocrevus for MS. Last infusion: 02/15/20, next: 09/18/20. Pt reports spasms in legs/thigh/calves, more on left side and in her back. Has noticed it occurring at work, which is new. Having extreme fatigue.    HISTORY OF PRESENT ILLNESS:  Angela Coffey is a 60 y.o. woman with relapsing remitting multiple sclerosis.  Update 06/12/2020: She is on Ocrevus for relapsing remitting MS.    She tolerates Ocrevus well.    .  Last MRI was 04/06/2017 and it did not show any new lesions.   Blood work 03/23/2017 showed normal IgG/IgA/IgM and CBC with differential.     She has no exacerbations but the spasms in her left leg seem worse,   She is walking fairly well and has no falls.   She occasionally catches one of her feet when more tired.    She has not had any more Lhermitte sign sensations.    She notes the left leg is slightly weaker than her right and has some spasticity..     Baclofen helps the spasms.  She has bladder urgency and some dribbling ut no frank incontinences.  Vision is stable.    She has fatigue and sometimes causes her to limit activity.    Nuvigil had helped but hsa not taken in the last month.   She has more depression and anxiety.   Lexapro was changed to Trintellix.   She is also on Wellbutrin  She had Covid-19 despite vaccination September 2021.   She felt poorly a week but had no shortness of breath.      MS history:    In 2008, she had the onset of numbness in the left arm. When symptoms persisted for a few weeks she was referred to Dr. Adella Hare. Nerve conduction and EMG  study was normal so an MRI of the cervical spine was ordered which was abnormal showing foci consistent with MS. An MRI of the brain showed classic MS lesions and she was diagnosed with MS. She was started on Rebif. She had some injection reactions her first year but generally has tolerated it well.  A couple years ago, she saw Dr. Leona Carry in [place] and Ashok Cordia was tried. However, she did not feel good when she took Aubagio and went back on Rebif. She has had a couple of sensory exacerbations and received some IV steroids.   She had her first course of ocrelizumab in March 2018.     Imaging:    MRI of the brain 03/03/2016 and the MRI of the brain from 07/10/2014 show classic periventricular, juxtacortical and deep white matter foci in a pattern and configuration consistent with MS. There are no new lesions and no changes compared to the 2016 MRI.   The MRI of the cervical spine 03/03/2016 showed 3 within the spinal cord, midline and just left of midline at C2-C3, left posteriorly at C4-C5 and right posteriorly at C6-C7. None of the foci appeared to be acute.  MRI brain 03/30/2017 showed Multiple T2/FLAIR hyperintense foci in the periventricular, juxtacortical and deep white matter of both hemispheres and a single punctate focus in the right cerebral  hemisphere. These are in a pattern and configuration consistent with chronic demyelinating plaque associated with multiple sclerosis. None of the foci appears to be acute. When compared to the MRI dated 03/03/2016, there is no interval change.    REVIEW OF SYSTEMS: Constitutional: No fevers, chills, sweats, or change in appetite.   Notes fatigue Eyes: No visual changes, double vision, eye pain Ear, nose and throat: No hearing loss, ear pain, nasal congestion, sore throat.  Has vertigo.    Cardiovascular: No chest pain, palpitations Respiratory: No shortness of breath at rest or with exertion.   No wheezes GastrointestinaI: No nausea, vomiting,  diarrhea, abdominal pain, fecal incontinence Genitourinary: No dysuria, urinary retention or frequency.  No nocturia. Musculoskeletal: Notes neck pain.  No back pain Integumentary: No rash, pruritus, skin lesions Neurological: as above Psychiatric: Notes depression and anxiety Endocrine: No palpitations, diaphoresis, change in appetite, change in weigh or increased thirst Hematologic/Lymphatic: No anemia, purpura, petechiae. Allergic/Immunologic: No itchy/runny eyes, nasal congestion, recent allergic reactions, rashes  ALLERGIES: No Known Allergies  HOME MEDICATIONS:  Current Outpatient Medications:  .  acetaminophen (TYLENOL) 500 MG tablet, Take 500 mg by mouth every 6 (six) hours as needed. Patient takes 2 tablets when she takes Rebif, Disp: , Rfl:  .  ALPRAZolam (XANAX) 0.25 MG tablet, Take 0.25 mg by mouth as needed. , Disp: , Rfl: 0 .  baclofen (LIORESAL) 10 MG tablet, TAKE 1 TABLET BY MOUTH THREE TIMES DAILY, Disp: 270 tablet, Rfl: 1 .  buPROPion (WELLBUTRIN XL) 300 MG 24 hr tablet, Take 300 mg by mouth daily., Disp: , Rfl:  .  cholecalciferol (VITAMIN D) 1000 UNITS tablet, Take 50,000 Units by mouth 2 (two) times a week. , Disp: , Rfl:  .  Desvenlafaxine Succinate ER 25 MG TB24, Take 1 tablet by mouth daily., Disp: , Rfl:  .  esomeprazole (NEXIUM) 20 MG capsule, Take 20 mg by mouth 2 (two) times daily., Disp: , Rfl:  .  Ibuprofen (MOTRIN PO), Take by mouth. Patient takes 200mg  as needed, Disp: , Rfl:  .  levothyroxine (SYNTHROID, LEVOTHROID) 200 MCG tablet, Take 200 mcg by mouth daily before breakfast. Takes daily - 7days a week, Disp: , Rfl:  .  nebivolol (BYSTOLIC) 5 MG tablet, Take 5 mg by mouth daily. Patient takes at nite, Disp: , Rfl:  .  ocrelizumab 600 mg in sodium chloride 0.9 % 500 mL, Inject 600 mg into the vein every 6 (six) months., Disp: , Rfl:  .  PRESCRIPTION MEDICATION, Reported on 05/27/2015, Disp: , Rfl:  .  rosuvastatin (CRESTOR) 5 MG tablet, Take 1 tablet by  mouth 3 (three) times a week., Disp: , Rfl: 4 .  vortioxetine HBr (TRINTELLIX) 5 MG TABS tablet, Take 5 mg by mouth daily., Disp: , Rfl:  .  Armodafinil 200 MG TABS, Take 1 tablet by mouth daily., Disp: 30 tablet, Rfl: 5 No current facility-administered medications for this visit.  Facility-Administered Medications Ordered in Other Visits:  .  gadopentetate dimeglumine (MAGNEVIST) injection 20 mL, 20 mL, Intravenous, Once PRN, Chaze Hruska, 05/29/2015, MD  PAST MEDICAL HISTORY: Past Medical History:  Diagnosis Date  . Anxiety   . Depression   . GERD (gastroesophageal reflux disease)   . Hypertension   . Hypothyroidism   . Multiple sclerosis (HCC)   . Varicose veins     PAST SURGICAL HISTORY: Past Surgical History:  Procedure Laterality Date  . CHOLECYSTECTOMY    . ENDOVENOUS ABLATION SAPHENOUS VEIN W/ LASER Right 07-03-2015  endovenous laser ablation right greater saphenous vein and stab phlebectomy 10-20 incisions right leg by Gretta Began MD  . ENDOVENOUS ABLATION SAPHENOUS VEIN W/ LASER Left 08-28-2015     endovenous laser ablation left greater saphenous vein and stab phlebectomy 10-20 incisions left leg  by Gretta Began MD   . GASTRIC BYPASS    . KNEE ARTHROSCOPY Left 04/19/2013   Procedure: LEFT KNEE ARTHROSCOPY AND DEBRIDEMENT, PARTIAL MEDIAL AND LATERAL MENISCECTOMY AND MEDIAL PATELLOFEMORAL AND LATERAL CHONDROPLASTY ;  Surgeon: Shelda Pal, MD;  Location: [facility];  Service: Orthopedics;  Laterality: Left;  . TONSILLECTOMY  1971    FAMILY HISTORY: Family History  Problem Relation Age of Onset  . Lupus Mother   . Diabetes Mother   . Heart disease Mother   . Heart failure Mother   . Diabetes Father   . Heart disease Father   . COPD Father   . Colon cancer Maternal Grandfather     SOCIAL HISTORY:  Social History   Socioeconomic History  . Marital status: Married    Spouse name: Not on file  . Number of children: 0  . Years of education: BS  . Highest education  level: Not on file  Occupational History  . Occupation: [employer]  Tobacco Use  . Smoking status: Never Smoker  . Smokeless tobacco: Never Used  Substance and Sexual Activity  . Alcohol use: No    Alcohol/week: 0.0 standard drinks  . Drug use: No  . Sexual activity: Not on file  Other Topics Concern  . Not on file  Social History Narrative   Drinks 1 cup of tea a day    Social Determinants of Health   Financial Resource Strain: Not on file  Food Insecurity: Not on file  Transportation Needs: Not on file  Physical Activity: Not on file  Stress: Not on file  Social Connections: Not on file  Intimate Partner Violence: Not on file     PHYSICAL EXAM  Vitals:   06/12/20 1559  BP: (!) 144/79  Pulse: 66  Weight: 255 lb (115.7 kg)  Height: 5\' 1"  (1.549 m)    Body mass index is 48.18 kg/m.   General: The patient is well-developed and well-nourished and in no acute distress   Neurologic Exam  Mental status: The patient is alert and oriented x 3 at the time of the examination. The patient has apparent normal recent and remote memory, with an apparently normal attention span and concentration ability.   Speech is normal.  Cranial nerves: Extraocular movements are full.  Facial strength and sensation is normal.  Trapezius strength is normal..  No obvious hearing deficits are noted.  Motor:  Muscle bulk is normal.   Muscle tone is fairly normal..  The muscle strength is 5/5 in all limbs  Sensory: Sensory testing shows normal and symmetric sensation to touch and vibration in the arms and legs..  Coordination: She has good finger-nose-finger but reduced heel-to-shin bilaterally.  Gait and station: Station is normal.   Gait is arthritic but wide based   No foot drop.  The tandem gait is wide.. Romberg is negative .   Reflexes: Deep tendon reflexes are symmetric and increased bilaterally at the knees and ankles.  There is no ankle clonus.      ASSESSMENT AND  PLAN  Multiple sclerosis (HCC) - Plan: MR BRAIN WO CONTRAST, MR CERVICAL SPINE WO CONTRAST, IgG, IgA, IgM, CBC with Differential/Platelet, Ambulatory referral to Behavioral Health  Gait disturbance -  Plan: MR BRAIN WO CONTRAST, MR CERVICAL SPINE WO CONTRAST  High risk medication use - Plan: IgG, IgA, IgM, CBC with Differential/Platelet  Other fatigue  Verbal fluency disorder  Depression with anxiety - Plan: Ambulatory referral to Behavioral Health   1.   Continue Ocrevus - she appears stable.   Check IgG/IgM/IgA and CBC/D today.  We also will check an MRI of the brain and cervical spine to determine if there is any breakthrough activity.  If present, we need to consider a different disease modifying therapy.   2.   She is noting more depression and anxiety.  Medication changes have occurred.  I will have her also see counseling with behavioral health.   3.   Nuvigil 200 mg every morning..   Continue baclofen.  4.   rtc 6 months, sooner if problems  45-minute office visit with the majority of the time spent face-to-face for history and physical, discussion/counseling and decision-making.  Additional time with record review and documentation.   Tasheema Perrone A. Epimenio Foot, MD, PhD 06/12/2020, 6:25 PM Certified in Neurology, Clinical Neurophysiology, Sleep Medicine, Pain Medicine and Neuroimaging  [facility] [address] [place], [place] [address] [phone]

## 2020-06-13 LAB — CBC WITH DIFFERENTIAL/PLATELET
Basophils Absolute: 0.1 x10E3/uL (ref 0.0–0.2)
Basos: 1 %
EOS (ABSOLUTE): 0.1 x10E3/uL (ref 0.0–0.4)
Eos: 2 %
Hematocrit: 43.1 % (ref 34.0–46.6)
Hemoglobin: 14.4 g/dL (ref 11.1–15.9)
Immature Grans (Abs): 0 x10E3/uL (ref 0.0–0.1)
Immature Granulocytes: 0 %
Lymphocytes Absolute: 2.1 x10E3/uL (ref 0.7–3.1)
Lymphs: 27 %
MCH: 29.7 pg (ref 26.6–33.0)
MCHC: 33.4 g/dL (ref 31.5–35.7)
MCV: 89 fL (ref 79–97)
Monocytes Absolute: 0.7 x10E3/uL (ref 0.1–0.9)
Monocytes: 9 %
Neutrophils Absolute: 4.6 x10E3/uL (ref 1.4–7.0)
Neutrophils: 61 %
Platelets: 356 x10E3/uL (ref 150–450)
RBC: 4.85 x10E6/uL (ref 3.77–5.28)
RDW: 12.2 % (ref 11.7–15.4)
WBC: 7.6 x10E3/uL (ref 3.4–10.8)

## 2020-06-13 LAB — IGG, IGA, IGM
IgA/Immunoglobulin A, Serum: 116 mg/dL (ref 87–352)
IgG (Immunoglobin G), Serum: 1085 mg/dL (ref 586–1602)
IgM (Immunoglobulin M), Srm: 29 mg/dL (ref 26–217)

## 2020-06-16 ENCOUNTER — Telehealth: Payer: Self-pay | Admitting: *Deleted

## 2020-06-16 NOTE — Telephone Encounter (Signed)
-----   Message from Asa Lente, MD sent at 06/13/2020  8:24 AM EST ----- Please let the patient know that the lab work is fine.

## 2020-06-16 NOTE — Telephone Encounter (Signed)
Received fax from [employer] that [place] approved 06/13/20-06/13/21. PA#[facility] Plan [id number] Non-grandfathered [id number].

## 2020-06-16 NOTE — Telephone Encounter (Signed)
Called and spoke with spouse, Amada Jupiter (on [place]). Relayed results per Dr. Epimenio Foot note. He verbalized understanding. States pt now has mychart on IPAD and able to view results on there as well.

## 2020-06-17 ENCOUNTER — Telehealth: Payer: Self-pay | Admitting: Neurology

## 2020-06-17 NOTE — Telephone Encounter (Signed)
Left message w/pt husband to give me back  [employer] auth: [id number] (exp. 06/16/20 to 12/12/20)

## 2020-06-18 NOTE — Telephone Encounter (Signed)
Patient returned my call she is scheduled at [facility] for 06/24/20.

## 2020-06-18 NOTE — Telephone Encounter (Signed)
x2 lvm on pt cell phone number & tried calling her work number

## 2020-06-24 ENCOUNTER — Ambulatory Visit: Payer: BC Managed Care – PPO

## 2020-06-24 DIAGNOSIS — R269 Unspecified abnormalities of gait and mobility: Secondary | ICD-10-CM

## 2020-06-24 DIAGNOSIS — G35 Multiple sclerosis: Secondary | ICD-10-CM

## 2020-12-11 ENCOUNTER — Ambulatory Visit: Payer: BC Managed Care – PPO | Admitting: Neurology

## 2020-12-11 ENCOUNTER — Encounter: Payer: Self-pay | Admitting: Neurology

## 2020-12-11 VITALS — BP 150/83 | HR 80 | Ht 62.5 in | Wt 256.0 lb

## 2020-12-11 DIAGNOSIS — R29818 Other symptoms and signs involving the nervous system: Secondary | ICD-10-CM | POA: Diagnosis not present

## 2020-12-11 DIAGNOSIS — G35 Multiple sclerosis: Secondary | ICD-10-CM | POA: Diagnosis not present

## 2020-12-11 DIAGNOSIS — R269 Unspecified abnormalities of gait and mobility: Secondary | ICD-10-CM

## 2020-12-11 DIAGNOSIS — F418 Other specified anxiety disorders: Secondary | ICD-10-CM

## 2020-12-11 DIAGNOSIS — R5383 Other fatigue: Secondary | ICD-10-CM

## 2020-12-11 MED ORDER — BACLOFEN 10 MG PO TABS
10.0000 mg | ORAL_TABLET | Freq: Three times a day (TID) | ORAL | 1 refills | Status: DC
Start: 2020-12-11 — End: 2021-06-01

## 2020-12-11 MED ORDER — ARMODAFINIL 200 MG PO TABS
1.0000 | ORAL_TABLET | Freq: Every day | ORAL | 1 refills | Status: DC
Start: 2020-12-11 — End: 2021-06-12

## 2020-12-11 NOTE — Progress Notes (Signed)
[facility]  PATIENT: Angela Coffey DOB: [birth year]  REFERRING DOCTOR OR PCP:  Philemon Kingdom (PCP); Quinn Plowman (Neurology) SOURCE: patient, notes from [facility]CP and [facility]; MRI images on PACS, Lab results  _________________________________   HISTORICAL  CHIEF COMPLAINT:  Chief Complaint  Patient presents with   Follow-up    Routine Visit, no new problems or concerns EMG 2, alone in room    HISTORY OF PRESENT ILLNESS:  Angela Coffey is a 60 y.o. woman with relapsing remitting multiple sclerosis.  Update 12/11/2020: She is on Ocrevus for relapsing remitting MS.    She tolerates Ocrevus well.    Her next infusion is November 2022.  Last MRI was 04/23/2021 and it did not show any new lesions in the brain or cervical spine   Blood work 06/12/2020 showed normal IgG/IgA/IgM and CBC with differential.     She has no exacerbations   She is walking fairly well and has no falls this year  She occasionally catches one of her feet when more tired.  No new weakness currently though legs seem heavier when she is tired.   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  She has not had any more Lhermitte sign sensations.     Baclofen helps the spasms.  She has bladder urgency and some dribbling ut no frank incontinences.  Vision is stable.    She has fatigue but feels I is affecting her less than it did.   She does worse in heat, however.      Nuvigil had helped the fatigue.   She has more depression and anxiety.  x.   She is also on Wellbutrin and Lexapro.   Cgnition is doing well   She had Covid-19 despite vaccination September 2021.   She felt poorly a week but had no shortness of breath.      MS history:    In 2008, she had the onset of numbness in the left arm. When symptoms persisted for a few weeks she was referred to Dr. Adella Hare. Nerve conduction and EMG study was normal so an MRI of the cervical spine was ordered which was abnormal  showing foci consistent with MS. An MRI of the brain showed classic MS lesions and she was diagnosed with MS. She was started on Rebif. She had some injection reactions her first year but generally has tolerated it well.  A couple years ago, she saw Dr. Leona Carry in [place] and Ashok Cordia was tried. However, she did not feel good when she took Aubagio and went back on Rebif. She has had a couple of sensory exacerbations and received some IV steroids.   She had her first course of ocrelizumab in March 2018.     Imaging:    MRI of the brain 03/03/2016 and the MRI of the brain from 07/10/2014 show classic periventricular, juxtacortical and deep white matter foci in a pattern and configuration consistent with MS. There are no new lesions and no changes compared to the 2016 MRI.   The MRI of the cervical spine 03/03/2016 showed 3 within the spinal cord, midline and just left of midline at C2-C3, left posteriorly at C4-C5 and right posteriorly at C6-C7. None of the foci appeared to be acute.  MRI brain 03/30/2017 showed Multiple T2/FLAIR hyperintense foci in the periventricular, juxtacortical and deep white matter of both hemispheres and a single punctate focus in the right cerebral hemisphere. These are in a pattern  and configuration consistent with chronic demyelinating plaque associated with multiple sclerosis. None of the foci appears to be acute. When compared to the MRI dated 03/03/2016, there is no interval change.   MRI brain 06/24/2020 showed multiple T2/FLAIR hyperintense foci in the hemispheres and a small focus in the right cerebellar hemisphere and a focus in the upper cervical spinal cord.  These are consistent with chronic demyelinating plaque associated with multiple sclerosis.  They do not appear to be acute.  Compared to the MRI from 03/30/2017, there are no new lesions.  MRI cervicalspine 06/24/2020  T2 hyperintense foci within the spinal cord centrally at C2 and towards the right at C6-C7.   These are consistent with chronic demyelinating plaque associated with multiple sclerosis.  They do not appear to be acute.  They were present on the 2017 MRI. 2.   At C3-C4, there are degenerative changes causing borderline spinal stenosis and moderate bilateral foraminal narrowing with some encroachment upon the C4 nerve roots.  Degenerative changes have progressed at this level compared to the 2017 MRI. 3.   At C4-C5, there are stable degenerative changes causing mild spinal stenosis and right greater than left foraminal narrowing but no nerve root compression. 4.   At C5-C6, there is 2 mm anterolisthesis and other degenerative changes causing mild spinal stenosis and mild bilateral foraminal narrowing but no nerve root compression.  The degenerative changes are stable compared to the 2017 MRI. 5.   At C6-7, there is 2 to 3 mm anterolisthesis and other degenerative changes but no spinal stenosis or nerve root compression.  The degenerative changes have progressed compared to the 2017 MRI.   REVIEW OF SYSTEMS: Constitutional: No fevers, chills, sweats, or change in appetite.   Notes fatigue Eyes: No visual changes, double vision, eye pain Ear, nose and throat: No hearing loss, ear pain, nasal congestion, sore throat.  Has vertigo.    Cardiovascular: No chest pain, palpitations Respiratory:  No shortness of breath at rest or with exertion.   No wheezes GastrointestinaI: No nausea, vomiting, diarrhea, abdominal pain, fecal incontinence Genitourinary:  No dysuria, urinary retention or frequency.  No nocturia. Musculoskeletal:  Notes neck pain.  No back pain Integumentary: No rash, pruritus, skin lesions Neurological: as above Psychiatric: Notes depression and anxiety Endocrine: No palpitations, diaphoresis, change in appetite, change in weigh or increased thirst Hematologic/Lymphatic:  No anemia, purpura, petechiae. Allergic/Immunologic: No itchy/runny eyes, nasal congestion, recent allergic  reactions, rashes  ALLERGIES: No Known Allergies  HOME MEDICATIONS:  Current Outpatient Medications:    acetaminophen (TYLENOL) 500 MG tablet, Take 500 mg by mouth every 6 (six) hours as needed. Patient takes 2 tablets when she takes Rebif, Disp: , Rfl:    ALPRAZolam (XANAX) 0.25 MG tablet, Take 0.25 mg by mouth as needed. , Disp: , Rfl: 0   buPROPion (WELLBUTRIN XL) 300 MG 24 hr tablet, Take 300 mg by mouth daily., Disp: , Rfl:    cholecalciferol (VITAMIN D) 1000 UNITS tablet, Take 50,000 Units by mouth 2 (two) times a week. , Disp: , Rfl:    Desvenlafaxine Succinate ER 25 MG TB24, Take 1 tablet by mouth daily., Disp: , Rfl:    esomeprazole (NEXIUM) 20 MG capsule, Take 20 mg by mouth 2 (two) times daily., Disp: , Rfl:    Ibuprofen (MOTRIN PO), Take by mouth. Patient takes 200mg  as needed, Disp: , Rfl:    levothyroxine (SYNTHROID, LEVOTHROID) 200 MCG tablet, Take 200 mcg by mouth daily before breakfast. Takes daily -  7days a week, Disp: , Rfl:    nebivolol (BYSTOLIC) 5 MG tablet, Take 5 mg by mouth daily. Patient takes at nite, Disp: , Rfl:    ocrelizumab 600 mg in sodium chloride 0.9 % 500 mL, Inject 600 mg into the vein every 6 (six) months., Disp: , Rfl:    rosuvastatin (CRESTOR) 5 MG tablet, Take 1 tablet by mouth 3 (three) times a week., Disp: , Rfl: 4   vortioxetine HBr (TRINTELLIX) 5 MG TABS tablet, Take 5 mg by mouth daily., Disp: , Rfl:    Armodafinil 200 MG TABS, Take 1 tablet by mouth daily., Disp: 90 tablet, Rfl: 1   baclofen (LIORESAL) 10 MG tablet, Take 1 tablet (10 mg total) by mouth 3 (three) times daily., Disp: 270 tablet, Rfl: 1   PRESCRIPTION MEDICATION, Reported on 05/27/2015, Disp: , Rfl:  No current facility-administered medications for this visit.  Facility-Administered Medications Ordered in Other Visits:    gadopentetate dimeglumine (MAGNEVIST) injection 20 mL, 20 mL, Intravenous, Once PRN, Orlen Leedy, Pearletha Furl, MD  PAST MEDICAL HISTORY: Past Medical History:   Diagnosis Date   Anxiety    Depression    GERD (gastroesophageal reflux disease)    Hypertension    Hypothyroidism    Multiple sclerosis (HCC)    Varicose veins     PAST SURGICAL HISTORY: Past Surgical History:  Procedure Laterality Date   CHOLECYSTECTOMY     ENDOVENOUS ABLATION SAPHENOUS VEIN W/ LASER Right 07-03-2015   endovenous laser ablation right greater saphenous vein and stab phlebectomy 10-20 incisions right leg by Gretta Began MD   ENDOVENOUS ABLATION SAPHENOUS VEIN W/ LASER Left 08-28-2015     endovenous laser ablation left greater saphenous vein and stab phlebectomy 10-20 incisions left leg  by Gretta Began MD    GASTRIC BYPASS     KNEE ARTHROSCOPY Left 04/19/2013   Procedure: LEFT KNEE ARTHROSCOPY AND DEBRIDEMENT, PARTIAL MEDIAL AND LATERAL MENISCECTOMY AND MEDIAL PATELLOFEMORAL AND LATERAL CHONDROPLASTY ;  Surgeon: Shelda Pal, MD;  Location: [facility];  Service: Orthopedics;  Laterality: Left;   TONSILLECTOMY  1971    FAMILY HISTORY: Family History  Problem Relation Age of Onset   Lupus Mother    Diabetes Mother    Heart disease Mother    Heart failure Mother    Diabetes Father    Heart disease Father    COPD Father    Colon cancer Maternal Grandfather     SOCIAL HISTORY:  Social History   Socioeconomic History   Marital status: Married    Spouse name: Not on file   Number of children: 0   Years of education: BS   Highest education level: Not on file  Occupational History   Occupation: [employer]  Tobacco Use   Smoking status: Never   Smokeless tobacco: Never  Substance and Sexual Activity   Alcohol use: No    Alcohol/week: 0.0 standard drinks   Drug use: No   Sexual activity: Not on file  Other Topics Concern   Not on file  Social History Narrative   Drinks 1 cup of tea a day    Social Determinants of Health   Financial Resource Strain: Not on file  Food Insecurity: Not on file  Transportation Needs: Not on file  Physical  Activity: Not on file  Stress: Not on file  Social Connections: Not on file  Intimate Partner Violence: Not on file     PHYSICAL EXAM  Vitals:   12/11/20 1438  BP: Marland Kitchen)  150/83  Pulse: 80  Weight: 256 lb (116.1 kg)  Height: 5' 2.5" (1.588 m)    Body mass index is 46.08 kg/m.   General: The patient is well-developed and well-nourished and in no acute distress   Neurologic Exam  Mental status: The patient is alert and oriented x 3 at the time of the examination. The patient has apparent normal recent and remote memory, with an apparently normal attention span and concentration ability.   Speech is normal.  Cranial nerves: Extraocular movements are full.  Facial strength and sensation is normal.  Trapezius strength is normal..  No obvious hearing deficits are noted.  Motor:  Muscle bulk is normal.   Muscle tone is fairly normal..  The muscle strength is 5/5 in all limbs  Sensory: Sensory testing shows normal and symmetric sensation to touch and vibration in the arms and legs..  Coordination: She has good finger-nose-finger but reduced heel-to-shin bilaterally.  Gait and station: Station is normal.   Gait is arthritic and wide   Poor tandem. . Romberg is negative .   Reflexes: Deep tendon reflexes are symmetric and increased bilaterally at the knees and ankles.  There is no ankle clonus.      ASSESSMENT AND PLAN  Multiple sclerosis (HCC)  Gait disturbance  Depression with anxiety  Lhermitte's sign positive  Other fatigue   1.   Continue Ocrevus ,   Labs have been fine.   MRI earlier in year was stable. - will check every 2 years or so  2.   Nuvigil 200 mg every morning..   Continue baclofen.  3.   Stay ative.   4.    rtc 6 months, sooner if problems  45-minute office visit with the majority of the time spent face-to-face for history and physical, discussion/counseling and decision-making.  Additional time with record review and documentation.   Aden Youngman A.  Epimenio Foot, MD, PhD 12/11/2020, 3:15 PM Certified in Neurology, Clinical Neurophysiology, Sleep Medicine, Pain Medicine and Neuroimaging  [facility] [address] [place], [place] [address] [phone]

## 2021-05-31 ENCOUNTER — Other Ambulatory Visit: Payer: Self-pay | Admitting: Neurology

## 2021-06-02 ENCOUNTER — Telehealth: Payer: Self-pay | Admitting: Family Medicine

## 2021-06-02 NOTE — Telephone Encounter (Signed)
I called to reschedule appointment due to Amy being out. Husband said he will have patient call us back. Amy will here on Friday 06/12/21 for these reschedules.

## 2021-06-10 NOTE — Progress Notes (Signed)
PATIENT: Angela Coffey DOB: October 19, 1960  REASON FOR VISIT: follow up HISTORY FROM: patient  Chief Complaint  Patient presents with   Follow-up    Rm 10, alone. Here for MS f/u on Ocrevus and tolerating well. Last infusion : 03/31/2021 and Next infusion : 09/29/2021. Pt reports doing well. No new sx but continues to have fatigue.     HISTORY OF PRESENT ILLNESS: 06/12/21 ALL:  Angela Coffey is a 61 y.o. 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 Last infusion in November. She states her BP became elevated and experienced a headache. Her PCP increased her Bystolic to 10 mg. Home SBP readings were running in the 160's, they are now running in the 140's. MRI cervical spine and brain stable 06/2020.   She reports doing well. She denies new or worsening symptoms. She feels that baclofen helps with muscle spasms. She takes one dose daily, and an additional dose several times a week.    She continues armodafinil and feels it helps a little with MS fatigue but it continues to be an issue. Discussed sleep apnea, and she reports that she doesn't snore much and have never been tested for sleep apnea. Mood is stable on Pristiq and Wellbutrin (PCP). She is sleeping well. She continues vitamin D, these labs are monitored by her PCP.  No changes in gait, bowel or bladder function. But endorses two bladder infections. No vision changes. She stays active with walking. But she does endorse having some SOB and it varies with and without activity. Denies chest pain, but did have a sinus infection in December and feels she may have another one now. Denies sore throat, fever, or chills. 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   HISTORY: (copied from Dr Bonnita Hollow previous note)  Angela Coffey is a 61 y.o. woman with relapsing remitting multiple sclerosis.   Update 12/11/2020: She is on Ocrevus for relapsing remitting MS.    She tolerates Ocrevus well.    Her next infusion is November 2022.  Last MRI  was 04/23/2021 and it did not show any new lesions in the brain or cervical spine   Blood work 06/12/2020 showed normal IgG/IgA/IgM and CBC with differential.      She has no exacerbations   She is walking fairly well and has no falls this year  She occasionally catches one of her feet when more tired.  No new weakness currently though legs seem heavier when she is tired.   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  She has not had any more Lhermitte sign sensations.     Baclofen helps the spasms.  She has bladder urgency and some dribbling ut no frank incontinences.  Vision is stable.     She has fatigue but feels I is affecting her less than it did.   She does worse in heat, however.      Nuvigil had helped the fatigue.   She has more depression and anxiety.  x.   She is also on Wellbutrin and Lexapro.   Cgnition is doing well    She had Covid-19 despite vaccination September 2021.   She felt poorly a week but had no shortness of breath.      MS history:    In 2008, she had the onset of numbness in the left arm. When symptoms persisted for a few weeks she was referred to Dr. Adella Hare. Nerve conduction and EMG study was  normal so an MRI of the cervical spine was ordered which was abnormal showing foci consistent with MS. An MRI of the brain showed classic MS lesions and she was diagnosed with MS. She was started on Rebif. She had some injection reactions her first year but generally has tolerated it well.  A couple years ago, she saw Dr. Leona Carry in [place] and Ashok Cordia was tried. However, she did not feel good when she took Aubagio and went back on Rebif. She has had a couple of sensory exacerbations and received some IV steroids.   She had her first course of ocrelizumab in March 2018.      Imaging:    MRI of the brain 03/03/2016 and the MRI of the brain from 07/10/2014 show classic periventricular, juxtacortical and deep white matter foci in a pattern and configuration  consistent with MS. There are no new lesions and no changes compared to the 2016 MRI.    The MRI of the cervical spine 03/03/2016 showed 3 within the spinal cord, midline and just left of midline at C2-C3, left posteriorly at C4-C5 and right posteriorly at C6-C7. None of the foci appeared to be acute.   MRI brain 03/30/2017 showed Multiple T2/FLAIR hyperintense foci in the periventricular, juxtacortical and deep white matter of both hemispheres and a single punctate focus in the right cerebral hemisphere. These are in a pattern and configuration consistent with chronic demyelinating plaque associated with multiple sclerosis. None of the foci appears to be acute. When compared to the MRI dated 03/03/2016, there is no interval change.    MRI brain 06/24/2020 showed multiple T2/FLAIR hyperintense foci in the hemispheres and a small focus in the right cerebellar hemisphere and a focus in the upper cervical spinal cord.  These are consistent with chronic demyelinating plaque associated with multiple sclerosis.  They do not appear to be acute.  Compared to the MRI from 03/30/2017, there are no new lesions.   MRI cervicalspine 06/24/2020  T2 hyperintense foci within the spinal cord centrally at C2 and towards the right at C6-C7.  These are consistent with chronic demyelinating plaque associated with multiple sclerosis.  They do not appear to be acute.  They were present on the 2017 MRI. 2.   At C3-C4, there are degenerative changes causing borderline spinal stenosis and moderate bilateral foraminal narrowing with some encroachment upon the C4 nerve roots.  Degenerative changes have progressed at this level compared to the 2017 MRI. 3.   At C4-C5, there are stable degenerative changes causing mild spinal stenosis and right greater than left foraminal narrowing but no nerve root compression. 4.   At C5-C6, there is 2 mm anterolisthesis and other degenerative changes causing mild spinal stenosis and mild bilateral  foraminal narrowing but no nerve root compression.  The degenerative changes are stable compared to the 2017 MRI. 5.   At C6-7, there is 2 to 3 mm anterolisthesis and other degenerative changes but no spinal stenosis or nerve root compression.  The degenerative changes have progressed compared to the 2017 MRI.   REVIEW OF SYSTEMS: Out of a complete 14 system review of symptoms, the patient complains only of the following symptoms, fatigue, muscle spasms, SOB, sinus pressure and all other reviewed systems are negative.  ALLERGIES: No Known Allergies  HOME MEDICATIONS: Outpatient Medications Prior to Visit  Medication Sig Dispense Refill   acetaminophen (TYLENOL) 500 MG tablet Take 500 mg by mouth every 6 (six) hours as needed. Patient takes 2 tablets when she  takes Rebif     ALPRAZolam (XANAX) 0.25 MG tablet Take 0.25 mg by mouth as needed.   0   baclofen (LIORESAL) 10 MG tablet TAKE 1 TABLET(10 MG) BY MOUTH THREE TIMES DAILY 270 tablet 1   buPROPion (WELLBUTRIN XL) 300 MG 24 hr tablet Take 300 mg by mouth daily.     cholecalciferol (VITAMIN D) 1000 UNITS tablet Take 50,000 Units by mouth 2 (two) times a week.      desvenlafaxine (PRISTIQ) 50 MG 24 hr tablet Take 50 mg by mouth daily.     esomeprazole (NEXIUM) 20 MG capsule Take 20 mg by mouth 2 (two) times daily.     Ibuprofen (MOTRIN PO) Take by mouth. Patient takes  as needed     levothyroxine (SYNTHROID, LEVOTHROID) 200 MCG tablet Take 200 mcg by mouth daily before breakfast. Takes daily - 7days a week     nebivolol (BYSTOLIC) 10 MG tablet Take 10 mg by mouth daily.     ocrelizumab 600 mg in sodium chloride 0.9 % 500 mL Inject 600 mg into the vein every 6 (six) months.     rosuvastatin (CRESTOR) 5 MG tablet Take 1 tablet by mouth 3 (three) times a week.  4   Armodafinil 200 MG TABS Take 1 tablet by mouth daily. 90 tablet 1   Desvenlafaxine Succinate ER 25 MG TB24 Take 1 tablet by mouth daily.     nebivolol (BYSTOLIC) 5 MG tablet  Take 5 mg by mouth daily. Patient takes at nite     vortioxetine HBr (TRINTELLIX) 5 MG TABS tablet Take 5 mg by mouth daily.     Facility-Administered Medications Prior to Visit  Medication Dose Route Frequency Provider Last Rate Last Admin   gadopentetate dimeglumine (MAGNEVIST) injection 20 mL  20 mL Intravenous Once PRN Sater, Pearletha Furl, MD        PAST MEDICAL HISTORY: Past Medical History:  Diagnosis Date   Anxiety    Depression    GERD (gastroesophageal reflux disease)    Hypertension    Hypothyroidism    Multiple sclerosis (HCC)    Varicose veins     PAST SURGICAL HISTORY: Past Surgical History:  Procedure Laterality Date   CHOLECYSTECTOMY     ENDOVENOUS ABLATION SAPHENOUS VEIN W/ LASER Right 07-03-2015   endovenous laser ablation right greater saphenous vein and stab phlebectomy 10-20 incisions right leg by Gretta Began MD   ENDOVENOUS ABLATION SAPHENOUS VEIN W/ LASER Left 08-28-2015     endovenous laser ablation left greater saphenous vein and stab phlebectomy 10-20 incisions left leg  by Gretta Began MD    GASTRIC BYPASS     KNEE ARTHROSCOPY Left 04/19/2013   Procedure: LEFT KNEE ARTHROSCOPY AND DEBRIDEMENT, PARTIAL MEDIAL AND LATERAL MENISCECTOMY AND MEDIAL PATELLOFEMORAL AND LATERAL CHONDROPLASTY ;  Surgeon: Shelda Pal, MD;  Location: [facility];  Service: Orthopedics;  Laterality: Left;   TONSILLECTOMY  1971    FAMILY HISTORY: Family History  Problem Relation Age of Onset   Lupus Mother    Diabetes Mother    Heart disease Mother    Heart failure Mother    Diabetes Father    Heart disease Father    COPD Father    Colon cancer Maternal Grandfather     SOCIAL HISTORY: Social History   Socioeconomic History   Marital status: Married    Spouse name: Not on file   Number of children: 0   Years of education: BS   Highest education level: Not on  file  Occupational History   Occupation: [employer]  Tobacco Use   Smoking status: Never   Smokeless  tobacco: Never  Substance and Sexual Activity   Alcohol use: No    Alcohol/week: 0.0 standard drinks   Drug use: No   Sexual activity: Not on file  Other Topics Concern   Not on file  Social History Narrative   Drinks 1 cup of tea a day    Social Determinants of Health   Financial Resource Strain: Not on file  Food Insecurity: Not on file  Transportation Needs: Not on file  Physical Activity: Not on file  Stress: Not on file  Social Connections: Not on file  Intimate Partner Violence: Not on file      PHYSICAL EXAM  Vitals:   06/12/21 0806  BP: 132/82  Pulse: (!) 58  SpO2: 94%  Weight: 114.5 kg  Height: 5' 2.5" (1.588 m)    Body mass index is 45.45 kg/m.  Generalized:  Well developed, in no acute distress  HEENT: Oral mucosa is pink and moist. Mild erythema to the throat, Pain in the frontal and maxillary sinuses. No adenopathy is noted. Cardiology: normal rate and rhythm, no murmur noted Respiratory: clear to auscultation bilaterally  Neurological examination  Mentation: Alert oriented to time, place, history taking. Follows all commands speech and language fluent Cranial nerve II-XII: Pupils were equal round reactive to light. Extraocular movements were full, visual field were full on confrontational test. Facial sensation and strength were normal. Uvula tongue midline. Head turning and shoulder shrug  were normal and symmetric. Motor: The motor testing reveals 5 over 5 strength of all 4 extremities. Good symmetric motor tone is noted throughout.  Sensory: Sensory testing is intact to soft touch on all 4 extremities. No evidence of extinction is noted.  Coordination: Cerebellar testing reveals good finger-nose-finger and heel-to-shin bilaterally.  Gait and station: wide, arthritic gait  DIAGNOSTIC DATA (LABS, IMAGING, TESTING) - I reviewed patient records, labs, notes, testing and imaging myself where available.  No flowsheet data found.   Lab Results   Component Value Date   WBC 7.6 06/12/2020   HGB 14.4 06/12/2020   HCT 43.1 06/12/2020   MCV 89 06/12/2020   PLT 356 06/12/2020      Component Value Date/Time   NA 141 10/02/2019 1611   K 4.6 10/02/2019 1611   CL 104 10/02/2019 1611   CO2 21 10/02/2019 1611   GLUCOSE 78 10/02/2019 1611   GLUCOSE 93 04/19/2013 0600   BUN 13 10/02/2019 1611   CREATININE 0.85 10/02/2019 1611   CALCIUM 9.2 10/02/2019 1611   PROT 6.4 10/02/2019 1611   ALBUMIN 4.3 10/02/2019 1611   AST 12 10/02/2019 1611   ALT 10 10/02/2019 1611   ALKPHOS 116 10/02/2019 1611   BILITOT 0.4 10/02/2019 1611   GFRNONAA 76 10/02/2019 1611   GFRAA 88 10/02/2019 1611   No results found for: CHOL, HDL, LDLCALC, LDLDIRECT, TRIG, CHOLHDL No results found for: NLZJ6B No results found for: VITAMINB12 No results found for: TSH     ASSESSMENT AND PLAN 61 y.o. year old female  has a past medical history of Anxiety, Depression, GERD (gastroesophageal reflux disease), Hypertension, Hypothyroidism, Multiple sclerosis (HCC), and Varicose veins. here with     ICD-10-CM   1. Relapsing remitting multiple sclerosis (HCC)  G35 CBC with Differential/Platelets    IgG, IgA, IgM       Since he is doing very well on Ocrevus infusions every 6  months.  We will continue current treatment plan. We will increase her armodafinil to 250 mg for MS fatigue.  May continue baclofen as needed for muscle spasms. We will update labs today. Last MRI stable in 2022. She was encouraged to follow up closely with PCP for BP, SOB, and sinus congestion if it worsens. She was encouraged to stay active. Well balanced diet and regular exercise encouraged. She will follow up in 6 months, sooner if needed. She verbalizes understanding and agreement with this plan.   Orders Placed This Encounter  Procedures   CBC with Differential/Platelets   IgG, IgA, IgM     Meds ordered this encounter  Medications   Armodafinil 250 MG tablet    Sig: Take 1 tablet (250  mg total) by mouth daily.    Dispense:  90 tablet    Refill:  1    Order Specific Question:   Supervising Provider    Answer:   Anson Fret [id number]      I spent 30 minutes with the patient. 50% of this time was spent counseling and educating patient on plan of care and medications.    Shawnie Dapper, FNP-C 06/12/2021, 10:44 AM [facility] [address] [place], [place] [address] [phone]

## 2021-06-10 NOTE — Patient Instructions (Addendum)
Below is our plan:  We will continue Ocrevus infusions. Continue baclofen 10mg  as needed and we will increase the armodafinil to 250mg  daily.   We discussed keeping a close watch on your blood pressures and to continue with follow up with your PCP.   Monitor your sleep habits and let know if you feel that you may need some information on sleep testing.   Also, we encourage you to reach out to your PCP regarding the SOB and sinus concerns if it gets any worse.   Try to increase her activity by adding in additional cardiovascular exercise such as walking.   Please make sure you are staying well hydrated. I recommend 50-60 ounces daily. Well balanced diet and regular exercise encouraged. Consistent sleep schedule with 6-8 hours recommended.   Please continue follow up with care team as directed.   Follow up with Dr in 6 months   You may receive a survey regarding today's visit. I encourage you to leave honest feed back as I do use this information to improve patient care. Thank you for seeing me today!

## 2021-06-12 ENCOUNTER — Telehealth: Payer: Self-pay | Admitting: Neurology

## 2021-06-12 ENCOUNTER — Ambulatory Visit: Payer: BC Managed Care – PPO | Admitting: Family Medicine

## 2021-06-12 ENCOUNTER — Encounter: Payer: Self-pay | Admitting: Family Medicine

## 2021-06-12 VITALS — BP 132/82 | HR 58 | Ht 62.5 in | Wt 252.5 lb

## 2021-06-12 DIAGNOSIS — G35 Multiple sclerosis: Secondary | ICD-10-CM

## 2021-06-12 MED ORDER — ARMODAFINIL 250 MG PO TABS: 250.0000 mg | ORAL_TABLET | Freq: Every day | ORAL | 1 refills | Status: DC

## 2021-06-12 NOTE — Telephone Encounter (Signed)
PA completed through [employer] KEY: [id number]

## 2021-06-13 LAB — CBC WITH DIFFERENTIAL/PLATELET
Basophils Absolute: 0.1 10*3/uL (ref 0.0–0.2)
Basos: 1 %
EOS (ABSOLUTE): 0.1 10*3/uL (ref 0.0–0.4)
Eos: 2 %
Hematocrit: 38.4 % (ref 34.0–46.6)
Hemoglobin: 12.8 g/dL (ref 11.1–15.9)
Immature Grans (Abs): 0 10*3/uL (ref 0.0–0.1)
Immature Granulocytes: 0 %
Lymphocytes Absolute: 1.8 10*3/uL (ref 0.7–3.1)
Lymphs: 32 %
MCH: 28.8 pg (ref 26.6–33.0)
MCHC: 33.3 g/dL (ref 31.5–35.7)
MCV: 86 fL (ref 79–97)
Monocytes Absolute: 0.6 10*3/uL (ref 0.1–0.9)
Monocytes: 10 %
Neutrophils Absolute: 3.1 10*3/uL (ref 1.4–7.0)
Neutrophils: 55 %
Platelets: 341 10*3/uL (ref 150–450)
RBC: 4.45 x10E6/uL (ref 3.77–5.28)
RDW: 12.8 % (ref 11.7–15.4)
WBC: 5.7 10*3/uL (ref 3.4–10.8)

## 2021-06-13 LAB — IGG, IGA, IGM
IgA/Immunoglobulin A, Serum: 96 mg/dL (ref 87–352)
IgG (Immunoglobin G), Serum: 1095 mg/dL (ref 586–1602)
IgM (Immunoglobulin M), Srm: 26 mg/dL (ref 26–217)

## 2021-06-15 NOTE — Telephone Encounter (Signed)
PA approved 06/12/21-06/12/22. PA# [facility] Plan [phone] Non-grandfathered [id number]

## 2021-06-18 ENCOUNTER — Ambulatory Visit: Payer: BC Managed Care – PPO | Admitting: Family Medicine

## 2021-10-08 HISTORY — PX: COLONOSCOPY: SHX174

## 2021-11-20 DIAGNOSIS — I10 Essential (primary) hypertension: Secondary | ICD-10-CM

## 2021-11-21 ENCOUNTER — Other Ambulatory Visit: Payer: Self-pay | Admitting: Neurology

## 2021-12-14 ENCOUNTER — Encounter: Payer: Self-pay | Admitting: Neurology

## 2021-12-14 ENCOUNTER — Ambulatory Visit: Payer: BC Managed Care – PPO | Admitting: Neurology

## 2021-12-14 VITALS — BP 136/75 | HR 78 | Ht 62.5 in | Wt 251.0 lb

## 2021-12-14 DIAGNOSIS — G35 Multiple sclerosis: Secondary | ICD-10-CM

## 2021-12-14 DIAGNOSIS — F418 Other specified anxiety disorders: Secondary | ICD-10-CM

## 2021-12-14 DIAGNOSIS — R269 Unspecified abnormalities of gait and mobility: Secondary | ICD-10-CM | POA: Diagnosis not present

## 2021-12-14 DIAGNOSIS — R5383 Other fatigue: Secondary | ICD-10-CM

## 2021-12-14 DIAGNOSIS — R29818 Other symptoms and signs involving the nervous system: Secondary | ICD-10-CM

## 2021-12-14 DIAGNOSIS — Z79899 Other long term (current) drug therapy: Secondary | ICD-10-CM

## 2021-12-14 NOTE — Progress Notes (Signed)
[facility]  PATIENT: Angela Coffey DOB: December 06, 1960  REFERRING DOCTOR OR PCP:  Philemon Kingdom (PCP); Quinn Plowman (Neurology) SOURCE: patient, notes from PCP and [facility]; MRI images on PACS, Lab results  _________________________________   HISTORICAL  CHIEF COMPLAINT:  Chief Complaint  Patient presents with   Follow-up    Rm 1, alone. Here for 6 month f/u for MS, on Ocrevus and tolerating well. Last infusion date: 10/29/2021 and Next infusion date: 04/29/2022. MS stable.    HISTORY OF PRESENT ILLNESS:  Angela Coffey is a 61 y.o. woman with relapsing remitting multiple sclerosis.  Update 12/14/2021: She is on Ocrevus for relapsing remitting MS.    She tolerates Ocrevus well.    Her next infusion is December 2023.  At the last infusion, she did not do Solu-Medrol and did just fine. (BP stayed in norml range this time)  Last MRI was 06/24/2020 and it did not show any new lesions in the brain or cervical spine   Blood work 06/12/2020 showed normal IgG/IgA/IgM and CBC with differential.     She has no exacerbations   She is walking fairly well and has no falls this year  She occasionally catches one of her feet when more tired.  No new weakness currently though legs seem heavier when she is tired.   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  She has not had any more Lhermitte sign sensations.     Baclofen helps the spasms.  She has bladder urgency and some dribbling ut no frank incontinences.  Vision is stable.    She has fatigue but feels I is affecting her less than it did.   She does worse in heat, however.      Nuvigil had helped the fatigue.   She has more depression and anxiety.  x.   She is also on Wellbutrin and Lexapro.   Cgnition is doing well   She had Covid-19 3 times despite vaccination September 2021.   She continues to do her vaccinations.         MS history:    In 2008, she had the onset of numbness in the left arm.  When symptoms persisted for a few weeks she was referred to Dr. Adella Hare. Nerve conduction and EMG study was normal so an MRI of the cervical spine was ordered which was abnormal showing foci consistent with MS. An MRI of the brain showed classic MS lesions and she was diagnosed with MS. She was started on Rebif. She had some injection reactions her first year but generally has tolerated it well.  A couple years ago, she saw Dr. Leona Carry in [place] and Ashok Cordia was tried. However, she did not feel good when she took Aubagio and went back on Rebif. She has had a couple of sensory exacerbations and received some IV steroids.   She had her first course of ocrelizumab in March 2018.     Imaging:    MRI of the brain 03/03/2016 and the MRI of the brain from 07/10/2014 show classic periventricular, juxtacortical and deep white matter foci in a pattern and configuration consistent with MS. There are no new lesions and no changes compared to the 2016 MRI.   The MRI of the cervical spine 03/03/2016 showed 3 within the spinal cord, midline and just left of midline at C2-C3, left posteriorly at C4-C5 and right posteriorly at C6-C7. None of the foci appeared to be acute.  MRI brain 03/30/2017 showed Multiple T2/FLAIR hyperintense foci in the periventricular, juxtacortical and deep white matter of both hemispheres and a single punctate focus in the right cerebral hemisphere. These are in a pattern and configuration consistent with chronic demyelinating plaque associated with multiple sclerosis. None of the foci appears to be acute. When compared to the MRI dated 03/03/2016, there is no interval change.   MRI brain 06/24/2020 showed multiple T2/FLAIR hyperintense foci in the hemispheres and a small focus in the right cerebellar hemisphere and a focus in the upper cervical spinal cord.  These are consistent with chronic demyelinating plaque associated with multiple sclerosis.  They do not appear to be acute.  Compared to  the MRI from 03/30/2017, there are no new lesions.  MRI cervicalspine 06/24/2020  T2 hyperintense foci within the spinal cord centrally at C2 and towards the right at C6-C7.  These are consistent with chronic demyelinating plaque associated with multiple sclerosis.  They do not appear to be acute.  They were present on the 2017 MRI. 2.   At C3-C4, there are degenerative changes causing borderline spinal stenosis and moderate bilateral foraminal narrowing with some encroachment upon the C4 nerve roots.  Degenerative changes have progressed at this level compared to the 2017 MRI. 3.   At C4-C5, there are stable degenerative changes causing mild spinal stenosis and right greater than left foraminal narrowing but no nerve root compression. 4.   At C5-C6, there is 2 mm anterolisthesis and other degenerative changes causing mild spinal stenosis and mild bilateral foraminal narrowing but no nerve root compression.  The degenerative changes are stable compared to the 2017 MRI. 5.   At C6-7, there is 2 to 3 mm anterolisthesis and other degenerative changes but no spinal stenosis or nerve root compression.  The degenerative changes have progressed compared to the 2017 MRI.   REVIEW OF SYSTEMS: Constitutional: No fevers, chills, sweats, or change in appetite.   Notes fatigue Eyes: No visual changes, double vision, eye pain Ear, nose and throat: No hearing loss, ear pain, nasal congestion, sore throat.  Has vertigo.    Cardiovascular: No chest pain, palpitations Respiratory:  No shortness of breath at rest or with exertion.   No wheezes GastrointestinaI: No nausea, vomiting, diarrhea, abdominal pain, fecal incontinence Genitourinary:  No dysuria, urinary retention or frequency.  No nocturia. Musculoskeletal:  Notes neck pain.  No back pain Integumentary: No rash, pruritus, skin lesions Neurological: as above Psychiatric: Notes depression and anxiety Endocrine: No palpitations, diaphoresis, change in  appetite, change in weigh or increased thirst Hematologic/Lymphatic:  No anemia, purpura, petechiae. Allergic/Immunologic: No itchy/runny eyes, nasal congestion, recent allergic reactions, rashes  ALLERGIES: No Known Allergies  HOME MEDICATIONS:  Current Outpatient Medications:    acetaminophen (TYLENOL) 500 MG tablet, Take 500 mg by mouth every 6 (six) hours as needed. Patient takes 2 tablets when she takes Rebif, Disp: , Rfl:    ALPRAZolam (XANAX) 0.25 MG tablet, Take 0.25 mg by mouth as needed. , Disp: , Rfl: 0   Armodafinil 250 MG tablet, Take 1 tablet (250 mg total) by mouth daily., Disp: 90 tablet, Rfl: 1   baclofen (LIORESAL) 10 MG tablet, TAKE 1 TABLET(10 MG) BY MOUTH THREE TIMES DAILY, Disp: 270 tablet, Rfl: 2   buPROPion (WELLBUTRIN XL) 300 MG 24 hr tablet, Take 300 mg by mouth daily., Disp: , Rfl:    cholecalciferol (VITAMIN D) 1000 UNITS tablet, Take 50,000 Units by mouth 2 (two) times a week. , Disp: , Rfl:  desvenlafaxine (PRISTIQ) 50 MG 24 hr tablet, Take 50 mg by mouth daily., Disp: , Rfl:    esomeprazole (NEXIUM) 20 MG capsule, Take 20 mg by mouth 2 (two) times daily., Disp: , Rfl:    Ibuprofen (MOTRIN PO), Take by mouth. Patient takes 200mg  as needed, Disp: , Rfl:    levothyroxine (SYNTHROID, LEVOTHROID) 200 MCG tablet, Take 200 mcg by mouth daily before breakfast. Takes daily - 7days a week, Disp: , Rfl:    losartan (COZAAR) 25 MG tablet, Take 25 mg by mouth daily., Disp: , Rfl:    nebivolol (BYSTOLIC) 10 MG tablet, Take 10 mg by mouth daily., Disp: , Rfl:    ocrelizumab 600 mg in sodium chloride 0.9 % 500 mL, Inject 600 mg into the vein every 6 (six) months., Disp: , Rfl:    Omega-3 Fatty Acids (FISH OIL) 1000 MG CAPS, Take 1 capsule by mouth 2 (two) times daily., Disp: , Rfl:    rosuvastatin (CRESTOR) 5 MG tablet, Take 1 tablet by mouth 3 (three) times a week., Disp: , Rfl: 4 No current facility-administered medications for this visit.  Facility-Administered  Medications Ordered in Other Visits:    gadopentetate dimeglumine (MAGNEVIST) injection 20 mL, 20 mL, Intravenous, Once PRN, Tavyn Kurka, , MD  PAST MEDICAL HISTORY: Past Medical History:  Diagnosis Date   Anxiety    Depression    GERD (gastroesophageal reflux disease)    Hypertension    Hypothyroidism    Multiple sclerosis (HCC)    Varicose veins     PAST SURGICAL HISTORY: Past Surgical History:  Procedure Laterality Date   CHOLECYSTECTOMY     ENDOVENOUS ABLATION SAPHENOUS VEIN W/ LASER Right 07-03-2015   endovenous laser ablation right greater saphenous vein and stab phlebectomy 10-20 incisions right leg by 07-05-2015 MD   ENDOVENOUS ABLATION SAPHENOUS VEIN W/ LASER Left 08-28-2015     endovenous laser ablation left greater saphenous vein and stab phlebectomy 10-20 incisions left leg  by 08-30-2015 MD    GASTRIC BYPASS     KNEE ARTHROSCOPY Left 04/19/2013   Procedure: LEFT KNEE ARTHROSCOPY AND DEBRIDEMENT, PARTIAL MEDIAL AND LATERAL MENISCECTOMY AND MEDIAL PATELLOFEMORAL AND LATERAL CHONDROPLASTY ;  Surgeon: 14/03/2013, MD;  Location: [facility];  Service: Orthopedics;  Laterality: Left;   TONSILLECTOMY  1971    FAMILY HISTORY: Family History  Problem Relation Age of Onset   Lupus Mother    Diabetes Mother    Heart disease Mother    Heart failure Mother    Diabetes Father    Heart disease Father    COPD Father    Colon cancer Maternal Grandfather     SOCIAL HISTORY:  Social History   Socioeconomic History   Marital status: Married    Spouse name: Not on file   Number of children: 0   Years of education: BS   Highest education level: Not on file  Occupational History   Occupation: [employer]  Tobacco Use   Smoking status: Never   Smokeless tobacco: Never  Substance and Sexual Activity   Alcohol use: No    Alcohol/week: 0.0 standard drinks of alcohol   Drug use: No   Sexual activity: Not on file  Other Topics Concern   Not on file  Social  History Narrative   Drinks 1 cup of tea a day    Social Determinants of Health   Financial Resource Strain: Not on file  Food Insecurity: Not on file  Transportation Needs: Not  on file  Physical Activity: Not on file  Stress: Not on file  Social Connections: Not on file  Intimate Partner Violence: Not on file     PHYSICAL EXAM  Vitals:   12/14/21 1009  BP: 136/75  Pulse: 78  Weight: 251 lb (113.9 kg)  Height: 5' 2.5" (1.588 m)    Body mass index is 45.18 kg/m.   General: The patient is well-developed and well-nourished and in no acute distress   Neurologic Exam  Mental status: The patient is alert and oriented x 3 at the time of the examination. The patient has apparent normal recent and remote memory, with an apparently normal attention span and concentration ability.   Speech is normal.  Cranial nerves: Extraocular movements are full.  Facial strength and sensation is normal.  Trapezius strength is normal..  No obvious hearing deficits are noted.  Motor:  Muscle bulk is normal.   Muscle tone is fairly normal..  The muscle strength is 5/5 in all limbs  Sensory: Sensory testing shows normal and symmetric sensation to touch and vibration in the arms and legs..  Coordination: She has good finger-nose-finger but reduced heel-to-shin bilaterally.  Gait and station: Station is normal.  She has an arthritic gait that is mildly wide.  This is stable.   Poor tandem. . Romberg is negative .   Reflexes: Deep tendon reflexes are symmetric and increased bilaterally at the knees and ankles.  There is no ankle clonus.      ASSESSMENT AND PLAN  Relapsing remitting multiple sclerosis (HCC)  Gait disturbance  Lhermitte's sign positive  Other fatigue  Depression with anxiety  High risk medication use   1.   She will continue Ocrevus ,   lab work was fine at the last visit.  We will recheck it again in early 2024.Marland Kitchen   MRI in 2022 was stable. - will check every 2 years or  so.  At the next visit we will check MRI of the brain to determine if there is any subclinical progression. 2.   Continue Nuvigil 200 mg every morning..   Continue baclofen.  3.   Stay ative.   4.    rtc 6 months, sooner if problems   Srinika Delone A. Epimenio Foot, MD, PhD 12/14/2021, 10:51 AM Certified in Neurology, Clinical Neurophysiology, Sleep Medicine, Pain Medicine and Neuroimaging  [facility] [address] [place], [place] [address] [phone]

## 2022-01-26 ENCOUNTER — Other Ambulatory Visit: Payer: Self-pay

## 2022-01-26 MED ORDER — ARMODAFINIL 250 MG PO TABS: 250.0000 mg | ORAL_TABLET | Freq: Every day | ORAL | 1 refills | Status: DC

## 2022-01-26 NOTE — Telephone Encounter (Signed)
Pt has an updated appoint and the registry has bee checked

## 2022-05-27 ENCOUNTER — Other Ambulatory Visit: Payer: Self-pay | Admitting: Neurology

## 2022-06-22 ENCOUNTER — Encounter: Payer: Self-pay | Admitting: Neurology

## 2022-06-22 ENCOUNTER — Ambulatory Visit: Payer: BC Managed Care – PPO | Admitting: Neurology

## 2022-06-22 VITALS — BP 136/79 | HR 78 | Ht 62.5 in | Wt 252.2 lb

## 2022-06-22 DIAGNOSIS — R269 Unspecified abnormalities of gait and mobility: Secondary | ICD-10-CM | POA: Diagnosis not present

## 2022-06-22 DIAGNOSIS — G35 Multiple sclerosis: Secondary | ICD-10-CM

## 2022-06-22 DIAGNOSIS — R5383 Other fatigue: Secondary | ICD-10-CM

## 2022-06-22 DIAGNOSIS — Z79899 Other long term (current) drug therapy: Secondary | ICD-10-CM | POA: Diagnosis not present

## 2022-06-22 DIAGNOSIS — R29818 Other symptoms and signs involving the nervous system: Secondary | ICD-10-CM

## 2022-06-22 MED ORDER — DONEPEZIL HCL 10 MG PO TABS: 10.0000 mg | ORAL_TABLET | Freq: Every day | ORAL | 3 refills | Status: DC

## 2022-06-22 NOTE — Progress Notes (Signed)
[facility]  PATIENT: Angela Coffey DOB: 08-16-1960  REFERRING DOCTOR OR PCP:  Ernestene Kiel (PCP); Teodora Medici (Neurology) SOURCE: patient, notes from PCP and [facility]; MRI images on PACS, Lab results  _________________________________   HISTORICAL  CHIEF COMPLAINT:  Chief Complaint  Patient presents with   Follow-up    RM 10. Last seen 12/14/21. MS DMT: Ocrevus. Doing well, no new sx.    HISTORY OF PRESENT ILLNESS:  Angela Coffey is a 62 y.o. woman with relapsing remitting multiple sclerosis.  Update 06/22/2022: She is on Ocrevus for relapsing remitting MS.    She tolerates Ocrevus well.    Last infusion was a couple weeks ago.   Last MRI was 06/24/2020 and it did not show any new lesions in the brain or cervical spine   Blood work 06/12/2020 showed normal IgG/IgA/IgM and CBC with differential.     She has no exacerbations   She is walking fairly well and has no falls this year  She occasionally catches one of her feet when more tired.  No new weakness currently though legs seem heavier when she is tired.   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  She no longer has a Lhermitte sign.     Spasticity is not too much of a problem (occurs if she over-stretches)  and Baclofen helps   She has bladder urgency and some dribbling ut no frank incontinences.  Vision is stable.    She has fatigue but feels I is affecting her less than it did.   She does worse in heat, however.      Nuvigil had helped the fatigue.   She Feels mood is currently doing well.    She is also on Wellbutrin and Lexapro.   Cgnition is doing ok but learning something new and remembering names is difficult        MS history:    In 2008, she had the onset of numbness in the left arm. When symptoms persisted for a few weeks she was referred to Dr. Metta Clines. Nerve conduction and EMG study was normal so an MRI of the cervical spine was ordered which was abnormal  showing foci consistent with MS. An MRI of the brain showed classic MS lesions and she was diagnosed with MS. She was started on Rebif. She had some injection reactions her first year but generally has tolerated it well.  A couple years ago, she saw Dr. Henrene Pastor in [place] was tried. However, she did not feel good when she took Aubagio and went back on Rebif. She has had a couple of sensory exacerbations and received some IV steroids.   She had her first course of ocrelizumab in March 2018.     Imaging:    MRI of the brain 03/03/2016 and the MRI of the brain from 07/10/2014 show classic periventricular, juxtacortical and deep white matter foci in a pattern and configuration consistent with MS. There are no new lesions and no changes compared to the 2016 MRI.   The MRI of the cervical spine 03/03/2016 showed 3 within the spinal cord, midline and just left of midline at C2-C3, left posteriorly at C4-C5 and right posteriorly at C6-C7. None of the foci appeared to be acute.  MRI brain 03/30/2017 showed Multiple T2/FLAIR hyperintense foci in the periventricular, juxtacortical and deep white matter of both hemispheres and a single punctate focus in the right cerebral hemisphere. These are in  a pattern and configuration consistent with chronic demyelinating plaque associated with multiple sclerosis. None of the foci appears to be acute. When compared to the MRI dated 03/03/2016, there is no interval change.   MRI brain 06/24/2020 showed multiple T2/FLAIR hyperintense foci in the hemispheres and a small focus in the right cerebellar hemisphere and a focus in the upper cervical spinal cord.  These are consistent with chronic demyelinating plaque associated with multiple sclerosis.  They do not appear to be acute.  Compared to the MRI from 03/30/2017, there are no new lesions.  MRI cervicalspine 06/24/2020  T2 hyperintense foci within the spinal cord centrally at C2 and towards the right at C6-C7.   These are consistent with chronic demyelinating plaque associated with multiple sclerosis.  They do not appear to be acute.  They were present on the 2017 MRI. 2.   At C3-C4, there are degenerative changes causing borderline spinal stenosis and moderate bilateral foraminal narrowing with some encroachment upon the C4 nerve roots.  Degenerative changes have progressed at this level compared to the 2017 MRI. 3.   At C4-C5, there are stable degenerative changes causing mild spinal stenosis and right greater than left foraminal narrowing but no nerve root compression. 4.   At C5-C6, there is 2 mm anterolisthesis and other degenerative changes causing mild spinal stenosis and mild bilateral foraminal narrowing but no nerve root compression.  The degenerative changes are stable compared to the 2017 MRI. 5.   At C6-7, there is 2 to 3 mm anterolisthesis and other degenerative changes but no spinal stenosis or nerve root compression.  The degenerative changes have progressed compared to the 2017 MRI.   REVIEW OF SYSTEMS: Constitutional: No fevers, chills, sweats, or change in appetite.   Notes fatigue Eyes: No visual changes, double vision, eye pain Ear, nose and throat: No hearing loss, ear pain, nasal congestion, sore throat.  Has vertigo.    Cardiovascular: No chest pain, palpitations Respiratory:  No shortness of breath at rest or with exertion.   No wheezes GastrointestinaI: No nausea, vomiting, diarrhea, abdominal pain, fecal incontinence Genitourinary:  No dysuria, urinary retention or frequency.  No nocturia. Musculoskeletal:  Notes neck pain.  No back pain Integumentary: No rash, pruritus, skin lesions Neurological: as above Psychiatric: Notes depression and anxiety Endocrine: No palpitations, diaphoresis, change in appetite, change in weigh or increased thirst Hematologic/Lymphatic:  No anemia, purpura, petechiae. Allergic/Immunologic: No itchy/runny eyes, nasal congestion, recent allergic  reactions, rashes  ALLERGIES: No Known Allergies  HOME MEDICATIONS:  Current Outpatient Medications:    acetaminophen (TYLENOL) 500 MG tablet, Take 500 mg by mouth every 6 (six) hours as needed. Patient takes 2 tablets when she takes Rebif, Disp: , Rfl:    ALPRAZolam (XANAX) 0.25 MG tablet, Take 0.25 mg by mouth as needed. , Disp: , Rfl: 0   Armodafinil 250 MG tablet, Take 1 tablet (250 mg total) by mouth daily., Disp: 90 tablet, Rfl: 1   baclofen (LIORESAL) 10 MG tablet, TAKE 1 TABLET(10 MG) BY MOUTH THREE TIMES DAILY, Disp: 270 tablet, Rfl: 2   buPROPion (WELLBUTRIN XL) 300 MG 24 hr tablet, Take 300 mg by mouth daily., Disp: , Rfl:    cholecalciferol (VITAMIN D) 1000 UNITS tablet, Take 50,000 Units by mouth 2 (two) times a week. , Disp: , Rfl:    desvenlafaxine (PRISTIQ) 50 MG 24 hr tablet, Take 50 mg by mouth daily., Disp: , Rfl:    esomeprazole (NEXIUM) 20 MG capsule, Take 20 mg by mouth  2 (two) times daily., Disp: , Rfl:    Ibuprofen (MOTRIN PO), Take 800 mg by mouth 2 (two) times daily. Patient takes 279m as needed, Disp: , Rfl:    levothyroxine (SYNTHROID, LEVOTHROID) 200 MCG tablet, Take 200 mcg by mouth daily before breakfast. Takes daily - 7days a week, Disp: , Rfl:    losartan (COZAAR) 25 MG tablet, Take 25 mg by mouth daily., Disp: , Rfl:    montelukast (SINGULAIR) 10 MG tablet, Take 10 mg by mouth at bedtime., Disp: , Rfl:    nebivolol (BYSTOLIC) 10 MG tablet, Take 10 mg by mouth daily., Disp: , Rfl:    ocrelizumab 600 mg in sodium chloride 0.9 % 500 mL, Inject 600 mg into the vein every 6 (six) months., Disp: , Rfl:    Omega-3 Fatty Acids (FISH OIL) 1000 MG CAPS, Take 1 capsule by mouth 2 (two) times daily., Disp: , Rfl:    rosuvastatin (CRESTOR) 5 MG tablet, Take 1 tablet by mouth 3 (three) times a week., Disp: , Rfl: 4 No current facility-administered medications for this visit.  Facility-Administered Medications Ordered in Other Visits:    gadopentetate dimeglumine  (MAGNEVIST) injection 20 mL, 20 mL, Intravenous, Once PRN, Carlei Huang, RNanine Means MD  PAST MEDICAL HISTORY: Past Medical History:  Diagnosis Date   Anxiety    Depression    GERD (gastroesophageal reflux disease)    Hypertension    Hypothyroidism    Multiple sclerosis ([place]    Varicose veins     PAST SURGICAL HISTORY: Past Surgical History:  Procedure Laterality Date   CHOLECYSTECTOMY     COLONOSCOPY  10/2021   ENDOVENOUS ABLATION SAPHENOUS VEIN W/ LASER Right 07/03/2015   endovenous laser ablation right greater saphenous vein and stab phlebectomy 10-20 incisions right leg by TCurt JewsMD   ENDOVENOUS ABLATION SAPHENOUS VEIN W/ LASER Left 08/28/2015   endovenous laser ablation left greater saphenous vein and stab phlebectomy 10-20 incisions left leg  by TCurt JewsMD    GASTRIC BYPASS     KNEE ARTHROSCOPY Left 04/19/2013   Procedure: LEFT KNEE ARTHROSCOPY AND DEBRIDEMENT, PARTIAL MEDIAL AND LATERAL MENISCECTOMY AND MEDIAL PATELLOFEMORAL AND LATERAL CHONDROPLASTY ;  Surgeon: MMauri Pole MD;  Location: [facility];  Service: Orthopedics;  Laterality: Left;   TONSILLECTOMY  1971    FAMILY HISTORY: Family History  Problem Relation Age of Onset   Lupus Mother    Diabetes Mother    Heart disease Mother    Heart failure Mother    Diabetes Father    Heart disease Father    COPD Father    Colon cancer Maternal Grandfather     SOCIAL HISTORY:  Social History   Socioeconomic History   Marital status: Married    Spouse name: Not on file   Number of children: 0   Years of education: BS   Highest education level: Not on file  Occupational History   Occupation: [employer]  Tobacco Use   Smoking status: Never   Smokeless tobacco: Never  Substance and Sexual Activity   Alcohol use: No    Alcohol/week: 0.0 standard drinks of alcohol   Drug use: No   Sexual activity: Not on file  Other Topics Concern   Not on file  Social History Narrative   Drinks 1 cup of tea a day     Right handed    Lives with husband   Social Determinants of Health   Financial Resource Strain: Not on file  Food  Insecurity: Not on file  Transportation Needs: Not on file  Physical Activity: Not on file  Stress: Not on file  Social Connections: Not on file  Intimate Partner Violence: Not on file     PHYSICAL EXAM  Vitals:   06/22/22 1528 06/22/22 1534  BP: (!) 141/82 136/79  Pulse: 74 78  Weight: 252 lb 3.2 oz (114.4 kg)   Height: 5' 2.5" (1.588 m)     Body mass index is 45.39 kg/m.   General: The patient is well-developed and well-nourished and in no acute distress   Neurologic Exam  Mental status: The patient is alert and oriented x 3 at the time of the examination. The patient has apparent normal recent and remote memory, with an apparently normal attention span and concentration ability.   Speech is normal.  Cranial nerves: Extraocular movements are full.  Facial strength and sensation is normal.  Trapezius strength is normal..  No obvious hearing deficits are noted.  Motor:  Muscle bulk is normal.   Muscle tone is fairly normal..  The muscle strength is 5/5 in all limbs  Sensory: Sensory testing shows normal and symmetric sensation to touch and vibration in the arms and legs..  Coordination: She has good finger-nose-finger but reduced heel-to-shin bilaterally.  Gait and station: Station is normal.  Gait is arthritic and mildly wide.  This is stable.   Mildly wide tandem. . Romberg is negative .   Reflexes: Deep tendon reflexes are symmetric and increased bilaterally at the knees and ankles.  There is no ankle clonus.      ASSESSMENT AND PLAN  Multiple sclerosis ([place]) - Plan: CBC with Differential/Platelet, IgG, IgA, IgM, MR BRAIN WO CONTRAST  Gait disturbance - Plan: MR BRAIN WO CONTRAST  Lhermitte's sign positive  High risk medication use  Other fatigue   1.   She will continue Ocrevus.  The IgM was low normal when we last checked it.  We will  check the IgG and IgM again and the CBC with differential.  MRI of the brain to determine if there is subclinical progression and consider a different DMT if that is occurring .  will check every 2 years or so.   2.   Continue  baclofen and other med's/  3.   Stay ative.   4.    rtc 6 months, sooner if problems   Chizuko Trine A. Felecia Shelling, MD, PhD [id number], [id number] PM Certified in Neurology, Clinical Neurophysiology, Sleep Medicine, Pain Medicine and Neuroimaging  [facility] [address], [place], [place] [address] [phone]

## 2022-06-23 LAB — CBC WITH DIFFERENTIAL/PLATELET
Basophils Absolute: 0 10*3/uL (ref 0.0–0.2)
Basos: 0 %
EOS (ABSOLUTE): 0.1 10*3/uL (ref 0.0–0.4)
Eos: 1 %
Hematocrit: 41.3 % (ref 34.0–46.6)
Hemoglobin: 13.5 g/dL (ref 11.1–15.9)
Immature Grans (Abs): 0 10*3/uL (ref 0.0–0.1)
Immature Granulocytes: 0 %
Lymphocytes Absolute: 2.5 10*3/uL (ref 0.7–3.1)
Lymphs: 27 %
MCH: 29.3 pg (ref 26.6–33.0)
MCHC: 32.7 g/dL (ref 31.5–35.7)
MCV: 90 fL (ref 79–97)
Monocytes Absolute: 0.7 10*3/uL (ref 0.1–0.9)
Monocytes: 8 %
Neutrophils Absolute: 5.7 10*3/uL (ref 1.4–7.0)
Neutrophils: 64 %
Platelets: 397 10*3/uL (ref 150–450)
RBC: 4.6 x10E6/uL (ref 3.77–5.28)
RDW: 12.8 % (ref 11.7–15.4)
WBC: 9 10*3/uL (ref 3.4–10.8)

## 2022-06-23 LAB — IGG, IGA, IGM
IgA/Immunoglobulin A, Serum: 99 mg/dL (ref 87–352)
IgG (Immunoglobin G), Serum: 1120 mg/dL (ref 586–1602)
IgM (Immunoglobulin M), Srm: 26 mg/dL (ref 26–217)

## 2022-06-28 ENCOUNTER — Telehealth: Payer: Self-pay | Admitting: Neurology

## 2022-06-28 NOTE — Telephone Encounter (Signed)
Patient was called on 2/15 to schedule MRI and her voice mail was full, sending mychart message asking her to call me.

## 2022-06-30 ENCOUNTER — Ambulatory Visit: Payer: BC Managed Care – PPO

## 2022-06-30 DIAGNOSIS — G35 Multiple sclerosis: Secondary | ICD-10-CM | POA: Diagnosis not present

## 2022-06-30 DIAGNOSIS — R269 Unspecified abnormalities of gait and mobility: Secondary | ICD-10-CM | POA: Diagnosis not present

## 2022-09-23 ENCOUNTER — Telehealth: Payer: Self-pay | Admitting: *Deleted

## 2022-09-23 NOTE — Telephone Encounter (Signed)
Submitted PA armodafinil on covermymeds. Key: [id number]. Waiting on determination from [employer].

## 2022-09-27 NOTE — Telephone Encounter (Signed)
PA approved 09/23/2022 - 09/23/2023. PA# [facility] Plan [id number] Non-Grandfathered [id number]

## 2022-09-30 ENCOUNTER — Other Ambulatory Visit: Payer: Self-pay | Admitting: Neurology

## 2022-12-16 ENCOUNTER — Encounter: Payer: Self-pay | Admitting: Neurology

## 2022-12-16 ENCOUNTER — Ambulatory Visit: Payer: BC Managed Care – PPO | Admitting: Neurology

## 2022-12-16 VITALS — BP 140/93 | HR 69 | Ht 62.0 in | Wt 263.5 lb

## 2022-12-16 DIAGNOSIS — R269 Unspecified abnormalities of gait and mobility: Secondary | ICD-10-CM

## 2022-12-16 DIAGNOSIS — G35 Multiple sclerosis: Secondary | ICD-10-CM | POA: Diagnosis not present

## 2022-12-16 DIAGNOSIS — F418 Other specified anxiety disorders: Secondary | ICD-10-CM

## 2022-12-16 DIAGNOSIS — R5383 Other fatigue: Secondary | ICD-10-CM

## 2022-12-16 DIAGNOSIS — Z79899 Other long term (current) drug therapy: Secondary | ICD-10-CM

## 2022-12-16 DIAGNOSIS — R4789 Other speech disturbances: Secondary | ICD-10-CM

## 2022-12-16 NOTE — Progress Notes (Signed)
[facility]  PATIENT: Angela Coffey DOB: 10/01/1960  REFERRING DOCTOR OR PCP:  Philemon Kingdom (PCP); Quinn Plowman (Neurology) SOURCE: patient, notes from PCP and [facility]; MRI images on PACS, Lab results  _________________________________   HISTORICAL  CHIEF COMPLAINT:  Chief Complaint  Patient presents with   Room 10    Pt is here Alone. Pt states that things have been going good with her MS since her last appointment. Pt states that she doesn't have any new complaints or concerns to discuss today.     HISTORY OF PRESENT ILLNESS:  Angela Coffey is a 62 y.o. woman with relapsing remitting multiple sclerosis.  Update 12/16/2022: She is on Ocrevus for relapsing remitting MS.    She tolerates Ocrevus well.    Last infusion was a couple weeks ago.   Last MRI was 06/24/2020 and it did not show any new lesions in the brain or cervical spine   Blood work 06/22/2022 showed normal IgG/IgA/IgM (borderline IgM)  and CBC with differential.     She has no exacerbations   She is walking fairly well and has no falls this year  She could walk > 1 mile but is not at baseline.   She occasionally catches one of her feet when more tired. She uses the bannister but notes it more for knees than balance.   No new weakness  /.  Numbness resolved.    She no longer has a Lhermitte sign.     Spasticity is not too much of a problem (occurs if she over-stretches)  and Baclofen (now only prn) helps     She has bladder urgency and some dribbling ut no frank incontinences.    Vision is stable.    She has fatigue but feels I is affecting her less than it did.   She does worse in heat, however.      Nuvigil had helped the fatigue and she tolerates it well.   Mood is doing well on Pristiq and  Wellbutrin.  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 When symptoms persisted for a few weeks she was referred to Dr. Adella Hare. Nerve conduction and EMG study was normal so an MRI of the cervical spine was ordered which was abnormal showing foci consistent with MS. An MRI of the brain showed classic MS lesions and she was diagnosed with MS. She was started on Rebif. She had some injection reactions her first year but generally has tolerated it well.  A couple years ago, she saw Dr. Leona Carry in [place] and Ashok Cordia was tried. However, she did not feel good when she took Aubagio and went back on Rebif. She has had a couple of sensory exacerbations and received some IV steroids.   She had her first course of ocrelizumab in March 2018.     Imaging:    MRI of the brain 03/03/2016 and the MRI of the brain from 07/10/2014 show classic periventricular, juxtacortical and deep white matter foci in a pattern and configuration consistent with MS. There are no new lesions and no changes compared to the 2016 MRI.   The MRI of the cervical spine 03/03/2016 showed 3 within the spinal cord, midline and just left  of midline at C2-C3, left posteriorly at C4-C5 and right posteriorly at C6-C7. None of the foci appeared to be acute.  MRI brain 03/30/2017 showed Multiple T2/FLAIR hyperintense foci in the periventricular, juxtacortical and deep white matter of both hemispheres and a single punctate focus in the right cerebral hemisphere. These are in a pattern and configuration consistent with chronic demyelinating plaque associated with multiple sclerosis. None of the foci appears to be acute. When compared to the MRI dated 03/03/2016, there is no interval change.   MRI brain 06/24/2020 showed multiple T2/FLAIR hyperintense foci in the hemispheres and a small focus in the right cerebellar hemisphere and a focus in the upper cervical spinal cord.  These are consistent with chronic demyelinating plaque associated with  multiple sclerosis.  They do not appear to be acute.  Compared to the MRI from 03/30/2017, there are no new lesions.  MRI cervicalspine 06/24/2020  T2 hyperintense foci within the spinal cord centrally at C2 and towards the right at C6-C7.  These are consistent with chronic demyelinating plaque associated with multiple sclerosis.  They do not appear to be acute.  They were present on the 2017 MRI. 2.   At C3-C4, there are degenerative changes causing borderline spinal stenosis and moderate bilateral foraminal narrowing with some encroachment upon the C4 nerve roots.  Degenerative changes have progressed at this level compared to the 2017 MRI. 3.   At C4-C5, there are stable degenerative changes causing mild spinal stenosis and right greater than left foraminal narrowing but no nerve root compression. 4.   At C5-C6, there is 2 mm anterolisthesis and other degenerative changes causing mild spinal stenosis and mild bilateral foraminal narrowing but no nerve root compression.  The degenerative changes are stable compared to the 2017 MRI. 5.   At C6-7, there is 2 to 3 mm anterolisthesis and other degenerative changes but no spinal stenosis or nerve root compression.  The degenerative changes have progressed compared to the 2017 MRI.  MRI brain 06/30/2022 showed multiple T2/FLAIR hyperintense foci in the cerebral hemispheres and one focus in the right cerebellar hemisphere in a pattern consistent with chronic demyelinating plaque associated with multiple sclerosis. None of the foci appear to be acute. Compared to the MRI from 2022, there were no new lesions    REVIEW OF SYSTEMS: Constitutional: No fevers, chills, sweats, or change in appetite.   Notes fatigue Eyes: No visual changes, double vision, eye pain Ear, nose and throat: No hearing loss, ear pain, nasal congestion, sore throat.  Has vertigo.    Cardiovascular: No chest pain, palpitations Respiratory:  No shortness of breath at rest or with exertion.    No wheezes GastrointestinaI: No nausea, vomiting, diarrhea, abdominal pain, fecal incontinence Genitourinary:  No dysuria, urinary retention or frequency.  No nocturia. Musculoskeletal:  Notes neck pain.  No back pain Integumentary: No rash, pruritus, skin lesions Neurological: as above Psychiatric: Notes depression and anxiety Endocrine: No palpitations, diaphoresis, change in appetite, change in weigh or increased thirst Hematologic/Lymphatic:  No anemia, purpura, petechiae. Allergic/Immunologic: No itchy/runny eyes, nasal congestion, recent allergic reactions, rashes  ALLERGIES: No Known Allergies  HOME MEDICATIONS:  Current Outpatient Medications:    acetaminophen (TYLENOL) 500 MG tablet, Take 500 mg by mouth every 6 (six) hours as needed. Patient takes 2 tablets when she takes Rebif, Disp: , Rfl:    ALPRAZolam (XANAX) 0.25 MG tablet, Take 0.25 mg by mouth as needed. , Disp: , Rfl: 0   Armodafinil 250 MG tablet, TAKE 1  TABLET(250 MG) BY MOUTH DAILY, Disp: 90 tablet, Rfl: 1   baclofen (LIORESAL) 10 MG tablet, TAKE 1 TABLET(10 MG) BY MOUTH THREE TIMES DAILY, Disp: 270 tablet, Rfl: 2   buPROPion (WELLBUTRIN XL) 300 MG 24 hr tablet, Take 300 mg by mouth daily., Disp: , Rfl:    cholecalciferol (VITAMIN D) 1000 UNITS tablet, Take 50,000 Units by mouth 2 (two) times a week. , Disp: , Rfl:    desvenlafaxine (PRISTIQ) 50 MG 24 hr tablet, Take 100 mg by mouth daily. 100 mg daily, Disp: , Rfl:    esomeprazole (NEXIUM) 20 MG capsule, Take 20 mg by mouth 2 (two) times daily., Disp: , Rfl:    Ibuprofen (MOTRIN PO), Take 800 mg by mouth 2 (two) times daily. Patient takes 200mg  as needed, Disp: , Rfl:    levothyroxine (SYNTHROID, LEVOTHROID) 200 MCG tablet, Take 200 mcg by mouth daily before breakfast. Takes daily - 7days a week, Disp: , Rfl:    losartan (COZAAR) 25 MG tablet, Take 25 mg by mouth daily., Disp: , Rfl:    montelukast (SINGULAIR) 10 MG tablet, Take 10 mg by mouth at bedtime., Disp: ,  Rfl:    nebivolol (BYSTOLIC) 10 MG tablet, Take 10 mg by mouth daily., Disp: , Rfl:    ocrelizumab 600 mg in sodium chloride 0.9 % 500 mL, Inject 600 mg into the vein every 6 (six) months., Disp: , Rfl:    Omega-3 Fatty Acids (FISH OIL) 1000 MG CAPS, Take 1 capsule by mouth 2 (two) times daily., Disp: , Rfl:    rosuvastatin (CRESTOR) 5 MG tablet, Take 1 tablet by mouth 3 (three) times a week., Disp: , Rfl: 4   donepezil (ARICEPT) 10 MG tablet, Take 1 tablet (10 mg total) by mouth at bedtime. (Patient not taking: Reported on 12/16/2022), Disp: 90 tablet, Rfl: 3 No current facility-administered medications for this visit.  Facility-Administered Medications Ordered in Other Visits:    gadopentetate dimeglumine (MAGNEVIST) injection 20 mL, 20 mL, Intravenous, Once PRN, Elex Mainwaring, Pearletha Furl, MD  PAST MEDICAL HISTORY: Past Medical History:  Diagnosis Date   Anxiety    Depression    GERD (gastroesophageal reflux disease)    Hypertension    Hypothyroidism    Multiple sclerosis (HCC)    Varicose veins     PAST SURGICAL HISTORY: Past Surgical History:  Procedure Laterality Date   CHOLECYSTECTOMY     COLONOSCOPY  10/2021   ENDOVENOUS ABLATION SAPHENOUS VEIN W/ LASER Right 07/03/2015   endovenous laser ablation right greater saphenous vein and stab phlebectomy 10-20 incisions right leg by Gretta Began MD   ENDOVENOUS ABLATION SAPHENOUS VEIN W/ LASER Left 08/28/2015   endovenous laser ablation left greater saphenous vein and stab phlebectomy 10-20 incisions left leg  by Gretta Began MD    GASTRIC BYPASS     KNEE ARTHROSCOPY Left 04/19/2013   Procedure: LEFT KNEE ARTHROSCOPY AND DEBRIDEMENT, PARTIAL MEDIAL AND LATERAL MENISCECTOMY AND MEDIAL PATELLOFEMORAL AND LATERAL CHONDROPLASTY ;  Surgeon: Shelda Pal, MD;  Location: [facility];  Service: Orthopedics;  Laterality: Left;   TONSILLECTOMY  1971    FAMILY HISTORY: Family History  Problem Relation Age of Onset   Lupus Mother    Diabetes Mother     Heart disease Mother    Heart failure Mother    Diabetes Father    Heart disease Father    COPD Father    Colon cancer Maternal Grandfather     SOCIAL HISTORY:  Social History   Socioeconomic  History   Marital status: Married    Spouse name: Not on file   Number of children: 0   Years of education: BS   Highest education level: Not on file  Occupational History   Occupation: [employer]  Tobacco Use   Smoking status: Never   Smokeless tobacco: Never  Substance and Sexual Activity   Alcohol use: No    Alcohol/week: 0.0 standard drinks of alcohol   Drug use: No   Sexual activity: Not on file  Other Topics Concern   Not on file  Social History Narrative   Drinks 1 cup of tea a day    Right handed    Lives with husband   Social Determinants of Health   Financial Resource Strain: Not on file  Food Insecurity: Not on file  Transportation Needs: Not on file  Physical Activity: Not on file  Stress: Not on file  Social Connections: Not on file  Intimate Partner Violence: Not on file     PHYSICAL EXAM  Vitals:   12/16/22 1036  BP: (!) 140/93  Pulse: 69  Weight: 263 lb 8 oz (119.5 kg)  Height: 5\' 2"  (1.575 m)    Body mass index is 48.19 kg/m.   General: The patient is well-developed and well-nourished and in no acute distress   Neurologic Exam  Mental status: The patient is alert and oriented x 3 at the time of the examination. The patient has apparent normal recent and remote memory, with an apparently normal attention span and concentration ability.   Speech is normal.  Cranial nerves: Extraocular movements are full.  Facial strength and sensation is normal.  Trapezius strength is normal..  No obvious hearing deficits are noted.  Motor:  Muscle bulk is normal.   Muscle tone is fairly normal..  The muscle strength is 5/5 in all limbs  Sensory: Sensory testing shows normal and symmetric sensation to touch and vibration in the arms and  legs..  Coordination: She has good finger-nose-finger but reduced heel-to-shin bilaterally.  Gait and station: Station is normal.  She has a stable gait compared to previous visits.  It is arthritic and mildly wide.  The tandem gait is mildly wide.. Romberg is negative .   Reflexes: Deep tendon reflexes are symmetric and increased bilaterally at the knees and ankles.  There is no ankle clonus.      ASSESSMENT AND PLAN  Multiple sclerosis (HCC) - Plan: IgG, IgA, IgM, CBC with Differential/Platelet  High risk medication use - Plan: IgG, IgA, IgM, CBC with Differential/Platelet  Gait disturbance  Other fatigue  Verbal fluency disorder  Depression with anxiety   1.   Continue Ocrevus.  The IgM was low normal when we last checked it.  We will check the IgG and IgM again and the CBC with differential. MRI check every 2 years or so.   2.   Continue  baclofen and other med's/  3.   Stay ative.   4.    rtc 6 months, sooner if problems   Dayla Gasca A. Epimenio Foot, MD, PhD 12/16/2022, 11:13 AM Certified in Neurology, Clinical Neurophysiology, Sleep Medicine, Pain Medicine and Neuroimaging  [facility] [address], [address] [place], [place] [address] [phone]

## 2022-12-20 ENCOUNTER — Other Ambulatory Visit: Payer: Self-pay | Admitting: Neurology

## 2022-12-21 NOTE — Telephone Encounter (Signed)
Last seen on 12/16/22 Follow up scheduled on 06/27/23

## 2022-12-22 ENCOUNTER — Ambulatory Visit: Payer: BC Managed Care – PPO | Admitting: Neurology

## 2023-02-08 ENCOUNTER — Other Ambulatory Visit: Payer: Self-pay | Admitting: Neurology

## 2023-02-09 NOTE — Telephone Encounter (Signed)
Last seen on 12/16/22 Follow up scheduled on 06/27/23 Last filled on 01/22/23 #5 tablets (5 day supply) Rx pending to be signed

## 2023-03-29 ENCOUNTER — Encounter: Payer: Self-pay | Admitting: *Deleted

## 2023-04-01 ENCOUNTER — Ambulatory Visit: Payer: BC Managed Care – PPO

## 2023-04-01 VITALS — BP 149/93 | HR 77 | Ht 62.5 in | Wt 245.0 lb

## 2023-04-01 DIAGNOSIS — E782 Mixed hyperlipidemia: Secondary | ICD-10-CM

## 2023-04-01 DIAGNOSIS — R0609 Other forms of dyspnea: Secondary | ICD-10-CM

## 2023-04-01 DIAGNOSIS — I10 Essential (primary) hypertension: Secondary | ICD-10-CM | POA: Diagnosis not present

## 2023-04-01 DIAGNOSIS — R072 Precordial pain: Secondary | ICD-10-CM | POA: Diagnosis not present

## 2023-04-01 DIAGNOSIS — R002 Palpitations: Secondary | ICD-10-CM | POA: Insufficient documentation

## 2023-04-01 DIAGNOSIS — E785 Hyperlipidemia, unspecified: Secondary | ICD-10-CM | POA: Insufficient documentation

## 2023-04-01 HISTORY — DX: Other forms of dyspnea: R06.09

## 2023-04-01 MED ORDER — LOSARTAN POTASSIUM 50 MG PO TABS: 50.0000 mg | ORAL_TABLET | Freq: Every day | ORAL | 3 refills | Status: DC

## 2023-04-01 MED ORDER — METOPROLOL TARTRATE 100 MG PO TABS: 100.0000 mg | ORAL_TABLET | Freq: Once | ORAL | 0 refills | Status: DC

## 2023-04-01 MED ORDER — ROSUVASTATIN CALCIUM 10 MG PO TABS: 10.0000 mg | ORAL_TABLET | Freq: Every day | ORAL | 3 refills | Status: DC

## 2023-04-01 NOTE — Assessment & Plan Note (Signed)
Last lipid panel October 2023 total Strahl 215, HDL 75, LDL 125, triglycerides 85. Continue Crestor titrate up the dose to 10 mg once daily.

## 2023-04-01 NOTE — Progress Notes (Signed)
Cardiology Consultation:    Date:  04/01/2023   ID:  Tracie Harrier, DOB [birth year], MRN [id number]  PCP:  Philemon Kingdom, MD  Cardiologist:  Marlyn Corporal Azavion Bouillon, MD   Referring MD: Philemon Kingdom, MD   No chief complaint on file.    ASSESSMENT AND PLAN:   Angela Coffey 62 year old with history of multiple sclerosis, hypertension, hyperlipidemia, hypothyroidism, GERD, depression, anxiety, obesity, osteoarthritis of the knees, presents with symptoms of palpitations that occurred for a sustained duration about a month ago without further recurrence.  Also has symptoms of dyspnea with exertion suggestive of angina.   Problem List Items Addressed This Visit     Palpitations - Primary    No further significant arrhythmias in about a month. Shorter episodes lasting few seconds occur randomly once or twice a week.  At this time we will hold off on extended monitoring given infrequent symptoms.  Reviewed options to consider devices such as smart watch or Kardia mobile to document rhythms at home home with symptoms She is familiar with Lourena Simmonds mobile device as her sister uses this for atrial fibrillation and she is willing to get this device to document rhythm with any of her symptoms in future.        Relevant Orders   EKG 12-Lead (Completed)   Basic Metabolic Panel (BMET)   Dyspnea on exertion    Progressive symptoms.  Anginal equivalent. No symptoms at rest. Does have cardiovascular risk factors.  Further evaluation for any coronary atherosclerosis and obstructive coronary artery disease discussed. Given the nonspecific EKG changes she has in inferolateral leads with T wave inversions which would likely result in nondiagnostic EKG findings on stress test we will proceed with CT coronary angiogram.  Discussed the procedure at length. She is not allergic to IV contrast. Will prescribe 1 dose of metoprolol tartrate 100 mg on the morning of the test to optimize heart  rates. She takes nebivolol 10 mg at nights.  She can continue taking this uninterrupted.  Will review the results once available.       Relevant Orders   CT CORONARY MORPH W/CTA COR W/SCORE W/CA W/CM &/OR WO/CM   Basic Metabolic Panel (BMET)   Hypertension    Suboptimal. Target below 130 over 80 mmHg. Continue nebivolol 10 mg once daily. Titrate up dose of losartan to 50 mg once daily.       Relevant Medications   rosuvastatin (CRESTOR) 10 MG tablet   losartan (COZAAR) 50 MG tablet   metoprolol tartrate (LOPRESSOR) 100 MG tablet   Hyperlipidemia    Last lipid panel October 2023 total [place] 215, HDL 75, LDL 125, triglycerides 85. Continue Crestor titrate up the dose to 10 mg once daily.       Relevant Medications   rosuvastatin (CRESTOR) 10 MG tablet   losartan (COZAAR) 50 MG tablet   metoprolol tartrate (LOPRESSOR) 100 MG tablet   Other Visit Diagnoses     Precordial pain       Relevant Orders   CT CORONARY MORPH W/CTA COR W/SCORE W/CA W/CM &/OR WO/CM     Review of the test results once available. Tentatively follow-up visit in 3 months.     History of Present Illness:    Angela Coffey is a 62 y.o. female who is being seen today for the evaluation of palpitations and shortness of breath on exertion  at the request of Philemon Kingdom, MD. Follows up with Dr. Epimenio Foot in Neurology for her multiple sclerosis management.  Has history  of hypertension, hyperlipidemia, multiple sclerosis, hypothyroidism, GERD, depression, anxiety, obesity, osteoarthritis of the knees. Denies any significant injury of CAD, CHF, MI, CVA. Reports family history of atrial fibrillation in her sister.  Very pleasant lady here for the visit by herself.  Teaches in middle school.  Reportedly was having symptoms of palpitations on the day working at school sitting down and grading papers, describes a sensation of extra beats, rapid sensation, lasted for an extended time even after she went  home.  She was feeling tired and fatigued.  Taking her blood pressure at home she noted the device notifying of irregular heartbeat.  She went to bed and symptoms resolved by the time she woke up.  No further recurrence of symptoms.  She did report occasional episodes of fluttering in his chest that last for few seconds but nothing sustained as the episode above.  She also noticed symptoms of dyspnea on exertion, noticeably when carrying objects and walking.  She does not report any significant symptoms with day-to-day activities.  Denies any symptoms at rest.  Mentions blood pressures at home is somewhat suboptimal.  Currently on nebivolol 10 mg once daily which she takes at night. She is also on losartan 25 mg once daily.  For dyslipidemia she has been on Crestor 2.5 mg once daily and few months ago this was increased to 5 mg once daily.   She does not smoke, drink alcohol or use recreational drugs. Notices increased symptoms of palpitations occasionally with coffee consumption.  EKG in the clinic today shows sinus rhythm heart rate 77/min, PR interval 192 ms, QRS duration 98 ms, QTc 430 ms.  Anteroseptal T wave inversions in lead V1.  Nonspecific ST-T changes in inferolateral leads likely repolarization changes in the setting of LVH.  In comparison prior EKG available from May 16, 2014 at [facility] noted sinus rhythm with heart rate 56/min with anteroseptal T wave inversions  Last echocardiogram to review is from November 20, 2021 at [facility] noted LVEF 55 to 60%, trace mitral regurgitation.  Lexiscan stress test from 2012 reported no ischemia  Recent blood work from 12/16/2022 noted hemoglobin 14, hematocrit 42.1, WBC 6.6, platelets 333 Last lipid panel available to review is from February 24, 2022 total cholesterol 215, HDL 75, LDL 125, triglycerides 85.    Past Medical History:  Diagnosis Date   Anxiety    Colon polyps    tubular adenomas 2023    Depression    GERD (gastroesophageal reflux disease)    Hyperlipidemia    Hypertension    Hypothyroidism    Morbid obesity (HCC)    Multiple sclerosis (HCC)    Osteoarthritis of both knees    Palpitations    Varicose veins of both lower extremities with pain 07/03/2015   Vitamin D deficiency     Past Surgical History:  Procedure Laterality Date   CHOLECYSTECTOMY  05/22/2014   [facility]   COLONOSCOPY  10/2021   ENDOVENOUS ABLATION SAPHENOUS VEIN W/ LASER Right 07/03/2015   endovenous laser ablation right greater saphenous vein and stab phlebectomy 10-20 incisions right leg by Gretta Began MD   ENDOVENOUS ABLATION SAPHENOUS VEIN W/ LASER Left 08/28/2015   endovenous laser ablation left greater saphenous vein and stab phlebectomy 10-20 incisions left leg  by Gretta Began MD    GASTRIC BYPASS     KNEE ARTHROSCOPY Left 04/19/2013   Procedure: LEFT KNEE ARTHROSCOPY AND DEBRIDEMENT, PARTIAL MEDIAL AND LATERAL MENISCECTOMY AND MEDIAL PATELLOFEMORAL AND LATERAL CHONDROPLASTY ;  Surgeon: Shelda Pal, MD;  Location: [facility];  Service: Orthopedics;  Laterality: Left;   TONSILLECTOMY  1971    Current Medications: Current Meds  Medication Sig   ALPRAZolam (XANAX) 0.25 MG tablet Take 0.25 mg by mouth 3 (three) times daily as needed for anxiety.   Armodafinil (NUVIGIL) 250 MG tablet Take 250 mg by mouth daily.   baclofen (LIORESAL) 10 MG tablet Take 10 mg by mouth 3 (three) times daily as needed for muscle spasms.   buPROPion (WELLBUTRIN XL) 300 MG 24 hr tablet Take 300 mg by mouth daily.   cholecalciferol (VITAMIN D) 1000 UNITS tablet Take 50,000 Units by mouth once a week.   clotrimazole-betamethasone (LOTRISONE) cream Apply 1 Application topically 2 (two) times daily.   desvenlafaxine (PRISTIQ) 100 MG 24 hr tablet Take 100 mg by mouth daily. 100 mg daily   diclofenac Sodium (VOLTAREN) 1 % GEL Apply 4 g topically 4 (four) times daily as needed (knee pain).   esomeprazole (NEXIUM) 40 MG  capsule Take 40 mg by mouth 2 (two) times daily before a meal.   fluticasone (FLONASE) 50 MCG/ACT nasal spray Place 1 spray into both nostrils daily.   Ibuprofen (MOTRIN PO) Take 800 mg by mouth 2 (two) times daily. Patient takes 200mg  as needed   levothyroxine (SYNTHROID, LEVOTHROID) 200 MCG tablet Take 200 mcg by mouth daily before breakfast. Takes daily - 7days a week   losartan (COZAAR) 50 MG tablet Take 1 tablet (50 mg total) by mouth daily.   metoprolol tartrate (LOPRESSOR) 100 MG tablet Take 1 tablet (100 mg total) by mouth once for 1 dose. Please take this medication 2 hours before CT.   montelukast (SINGULAIR) 10 MG tablet Take 10 mg by mouth at bedtime.   naltrexone (DEPADE) 50 MG tablet Take 50 mg by mouth daily.   nebivolol (BYSTOLIC) 10 MG tablet Take 10 mg by mouth daily.   ocrelizumab (OCREVUS) 300 MG/10ML injection Inject 300 mg into the vein every 6 (six) months.   rosuvastatin (CRESTOR) 10 MG tablet Take 1 tablet (10 mg total) by mouth daily.   silver sulfADIAZINE (SILVADENE) 1 % cream Apply 1 Application topically daily as needed (skin tears).   [DISCONTINUED] rosuvastatin (CRESTOR) 5 MG tablet Take 1 tablet by mouth daily.     Allergies:   Abilify [aripiprazole] and Cymbalta [duloxetine hcl]   Social History   Socioeconomic History   Marital status: Married    Spouse name: Not on file   Number of children: 0   Years of education: BS   Highest education level: Not on file  Occupational History   Occupation: [employer]  Tobacco Use   Smoking status: Never   Smokeless tobacco: Never  Substance and Sexual Activity   Alcohol use: No    Alcohol/week: 0.0 standard drinks of alcohol   Drug use: No   Sexual activity: Not on file  Other Topics Concern   Not on file  Social History Narrative   Drinks 1 cup of tea a day    Right handed    Lives with husband   Social Determinants of Health   Financial Resource Strain: Not on file  Food Insecurity: Not on  file  Transportation Needs: Not on file  Physical Activity: Not on file  Stress: Not on file  Social Connections: Not on file     Family History: The patient's family history includes COPD in her father; Colon cancer in her maternal grandfather; Diabetes in her father  and mother; Heart disease in her father and mother; Heart failure in her mother; Lupus in her mother. ROS:   Please see the history of present illness.    All 14 point review of systems negative except as described per history of present illness.  EKGs/Labs/Other Studies Reviewed:    The following studies were reviewed today:   EKG:  EKG Interpretation Date/Time:  Friday April 01 2023 15:00:02 EST Ventricular Rate:  77 PR Interval:  192 QRS Duration:  98 QT Interval:  380 QTC Calculation: 430 R Axis:   -11  Text Interpretation: Normal sinus rhythm Moderate voltage criteria for LVH, may be normal variant ( R in aVL , Cornell product ) When compared with ECG of 19-Apr-2013 06:48, No significant change was found Confirmed by Huntley Dec reddy [phone]) on 04/01/2023 3:42:47 PM    Recent Labs: 12/16/2022: Hemoglobin 14.0; Platelets 333  Recent Lipid Panel No results found for: "CHOL", "TRIG", "HDL", "CHOLHDL", "VLDL", "LDLCALC", "LDLDIRECT"  Physical Exam:    VS:  BP (!) 149/93 (BP Location: Right Arm, Patient Position: Sitting, Cuff Size: Normal)   Pulse 77   Ht 5' 2.5" (1.588 m)   Wt 245 lb (111.1 kg)   SpO2 96%   BMI 44.10 kg/m     Wt Readings from Last 3 Encounters:  04/01/23 245 lb (111.1 kg)  03/15/23 250 lb (113.4 kg)  12/16/22 263 lb 8 oz (119.5 kg)     GENERAL:  Well nourished, well developed in no acute distress NECK: No JVD; No carotid bruits CARDIAC: RRR, S1 and S2 present, no murmurs, no rubs, no gallops CHEST:  Clear to auscultation without rales, wheezing or rhonchi  Extremities: No pitting pedal edema. Pulses bilaterally symmetric with radial 2+ and dorsalis pedis 2+ NEUROLOGIC:   Alert and oriented x 3  Medication Adjustments/Labs and Tests Ordered: Current medicines are reviewed at length with the patient today.  Concerns regarding medicines are outlined above.  Orders Placed This Encounter  Procedures   CT CORONARY MORPH W/CTA COR W/SCORE W/CA W/CM &/OR WO/CM   Basic Metabolic Panel (BMET)   EKG 12-Lead   Meds ordered this encounter  Medications   rosuvastatin (CRESTOR) 10 MG tablet    Sig: Take 1 tablet (10 mg total) by mouth daily.    Dispense:  90 tablet    Refill:  3   losartan (COZAAR) 50 MG tablet    Sig: Take 1 tablet (50 mg total) by mouth daily.    Dispense:  90 tablet    Refill:  3   metoprolol tartrate (LOPRESSOR) 100 MG tablet    Sig: Take 1 tablet (100 mg total) by mouth once for 1 dose. Please take this medication 2 hours before CT.    Dispense:  1 tablet    Refill:  0    Signed, Sherrel Shafer reddy Emma-Lee Oddo, MD, MPH, [facility]. 04/01/2023 3:42 PM    [facility]

## 2023-04-01 NOTE — Assessment & Plan Note (Signed)
Progressive symptoms.  Anginal equivalent. No symptoms at rest. Does have cardiovascular risk factors.  Further evaluation for any coronary atherosclerosis and obstructive coronary artery disease discussed. Given the nonspecific EKG changes she has in inferolateral leads with T wave inversions which would likely result in nondiagnostic EKG findings on stress test we will proceed with CT coronary angiogram.  Discussed the procedure at length. She is not allergic to IV contrast. Will prescribe 1 dose of metoprolol tartrate 100 mg on the morning of the test to optimize heart rates. She takes nebivolol 10 mg at nights.  She can continue taking this uninterrupted.  Will review the results once available.

## 2023-04-01 NOTE — Patient Instructions (Signed)
Medication Instructions:  Your physician has recommended you make the following change in your medication:   START: Crestor 10 mg daily START: Losartan 50 mg daily  *If you need a refill on your cardiac medications before your next appointment, please call your pharmacy*   Lab Work: Your physician recommends that you return for lab work in:   Labs today: BMP  If you have labs (blood work) drawn today and your tests are completely normal, you will receive your results only by: MyChart Message (if you have MyChart) OR A paper copy in the mail If you have any lab test that is abnormal or we need to change your treatment, we will call you to review the results.   Testing/Procedures:   Your cardiac CT will be scheduled at one of the below locations:   [facility] [address] [place], [place] [address] [phone]  OR  [facility] [address] [place], [place] [address] [phone]  OR   [facility] [address] [place], [place] [address] [phone]  If scheduled at [facility], please arrive at the [facility] and Children's Entrance (Entrance C2) of [facility] 30 minutes prior to test start time. You can use the FREE valet parking offered at entrance C (encouraged to control the heart rate for the test)  Proceed to the [facility] Radiology Department (first floor) to check-in and test prep.  All radiology patients and guests should use entrance C2 at [facility], accessed from [facility], even though the hospital's physical address listed is [address]    If scheduled at [facility] or [facility], please arrive 15 mins early for check-in and test prep.  There is spacious parking and easy access to the radiology department from the [facility] Heart and Vascular entrance. Please enter  here and check-in with the desk attendant.   Please follow these instructions carefully (unless otherwise directed):  An IV will be required for this test and Nitroglycerin will be given.  Hold all erectile dysfunction medications at least 3 days (72 hrs) prior to test. (Ie viagra, cialis, sildenafil, tadalafil, etc)   On the Night Before the Test: Be sure to Drink plenty of water. Do not consume any caffeinated/decaffeinated beverages or chocolate 12 hours prior to your test. Do not take any antihistamines 12 hours prior to your test.  On the Day of the Test: Drink plenty of water until 1 hour prior to the test. Do not eat any food 1 hour prior to test. You may take your regular medications prior to the test.  Take metoprolol (Lopressor) two hours prior to test.       After the Test: Drink plenty of water. After receiving IV contrast, you may experience a mild flushed feeling. This is normal. On occasion, you may experience a mild rash up to 24 hours after the test. This is not dangerous. If this occurs, you can take Benadryl 25 mg and increase your fluid intake. If you experience trouble breathing, this can be serious. If it is severe call 911 IMMEDIATELY. If it is mild, please call our office.  We will call to schedule your test 2-4 weeks out understanding that some insurance companies will need an authorization prior to the service being performed.   For more information and frequently asked questions, please visit our website : [email or web address]  For non-scheduling related questions, please  contact the cardiac imaging nurse navigator should you have any questions/concerns: Cardiac Imaging Nurse Navigators Direct Office Dial: [phone]   For scheduling needs, including cancellations and rescheduling, please call [place], [phone].    Follow-Up: At [facility], you and your health needs are our priority.  As part of our continuing mission to  provide you with exceptional heart care, we have created designated Provider Care Teams.  These Care Teams include your primary Cardiologist (physician) and Advanced Practice Providers (APPs -  Physician Assistants and Nurse Practitioners) who all work together to provide you with the care you need, when you need it.  We recommend signing up for the patient portal called "MyChart".  Sign up information is provided on this After Visit Summary.  MyChart is used to connect with patients for Virtual Visits (Telemedicine).  Patients are able to view lab/test results, encounter notes, upcoming appointments, etc.  Non-urgent messages can be sent to your provider as well.   To learn more about what you can do with MyChart, go to [email or web address].    Your next appointment:   3 month(s)  Provider:   Huntley Dec, MD    Other Instructions None

## 2023-04-01 NOTE — Assessment & Plan Note (Signed)
Suboptimal. Target below 130 over 80 mmHg. Continue nebivolol 10 mg once daily. Titrate up dose of losartan to 50 mg once daily.

## 2023-04-01 NOTE — Assessment & Plan Note (Signed)
No further significant arrhythmias in about a month. Shorter episodes lasting few seconds occur randomly once or twice a week.  At this time we will hold off on extended monitoring given infrequent symptoms.  Reviewed options to consider devices such as smart watch or Kardia mobile to document rhythms at home home with symptoms She is familiar with Angela Coffey mobile device as her sister uses this for atrial fibrillation and she is willing to get this device to document rhythm with any of her symptoms in future.

## 2023-04-02 LAB — BASIC METABOLIC PANEL
BUN/Creatinine Ratio: 24 (ref 12–28)
BUN: 19 mg/dL (ref 8–27)
CO2: 23 mmol/L (ref 20–29)
Calcium: 9.7 mg/dL (ref 8.7–10.3)
Chloride: 98 mmol/L (ref 96–106)
Creatinine, Ser: 0.8 mg/dL (ref 0.57–1.00)
Glucose: 84 mg/dL (ref 70–99)
Potassium: 4.7 mmol/L (ref 3.5–5.2)
Sodium: 138 mmol/L (ref 134–144)
eGFR: 84 mL/min/{1.73_m2} (ref >59)

## 2023-04-19 ENCOUNTER — Encounter (HOSPITAL_COMMUNITY): Payer: Self-pay

## 2023-04-20 ENCOUNTER — Telehealth (HOSPITAL_COMMUNITY): Payer: Self-pay | Admitting: *Deleted

## 2023-04-20 NOTE — Telephone Encounter (Signed)
Attempted to call patient regarding upcoming cardiac CT appointment. Left message on voicemail with name and callback number Hayley Sharpe RN Navigator Cardiac Imaging [facility] [phone] Office   

## 2023-04-21 ENCOUNTER — Ambulatory Visit (HOSPITAL_COMMUNITY)
Admission: RE | Admit: 2023-04-21 | Discharge: 2023-04-21 | Disposition: A | Discharge status: Home / Self Care | Payer: BC Managed Care – PPO | Source: Ambulatory Visit

## 2023-04-21 DIAGNOSIS — R072 Precordial pain: Secondary | ICD-10-CM | POA: Insufficient documentation

## 2023-04-21 DIAGNOSIS — R0609 Other forms of dyspnea: Secondary | ICD-10-CM | POA: Diagnosis present

## 2023-04-21 MED ORDER — NITROGLYCERIN 0.4 MG SL SUBL
SUBLINGUAL_TABLET | SUBLINGUAL | Status: AC
  Filled 2023-04-21: qty 2

## 2023-04-21 MED ORDER — IOHEXOL 350 MG/ML SOLN
95.0000 mL | Freq: Once | INTRAVENOUS | Status: AC | PRN
  Administered 2023-04-21: 95 mL via INTRAVENOUS

## 2023-04-21 MED ORDER — IOHEXOL 350 MG/ML SOLN: 95.0000 mL | Freq: Once | INTRAVENOUS | Status: DC | PRN

## 2023-04-21 MED ORDER — NITROGLYCERIN 0.4 MG SL SUBL
0.8000 mg | SUBLINGUAL_TABLET | Freq: Once | SUBLINGUAL | Status: AC
Start: 2023-04-21 — End: 2023-04-21
  Administered 2023-04-21: 0.8 mg via SUBLINGUAL

## 2023-04-24 DIAGNOSIS — I2510 Atherosclerotic heart disease of native coronary artery without angina pectoris: Secondary | ICD-10-CM

## 2023-04-24 HISTORY — DX: Atherosclerotic heart disease of native coronary artery without angina pectoris: I25.10

## 2023-04-26 NOTE — Telephone Encounter (Signed)
Spoke with pt who states that she takes her medications at night and then checks her BP in the morning. Please advise.

## 2023-04-28 ENCOUNTER — Telehealth: Payer: Self-pay

## 2023-04-28 NOTE — Telephone Encounter (Signed)
Pt states that she received a call from Richard.

## 2023-04-28 NOTE — Telephone Encounter (Signed)
Patient returned RN's call regarding medication.

## 2023-04-29 NOTE — Telephone Encounter (Signed)
Patient returned RN Richard's call.

## 2023-05-12 NOTE — Telephone Encounter (Signed)
 Unable to locate note from Gerlene Burdock, [employer] message has addressed medications.

## 2023-06-27 ENCOUNTER — Ambulatory Visit: Payer: 59 | Admitting: Neurology

## 2023-06-27 ENCOUNTER — Encounter: Payer: Self-pay | Admitting: Neurology

## 2023-06-27 VITALS — BP 133/78 | HR 64 | Ht 62.0 in | Wt 251.0 lb

## 2023-06-27 DIAGNOSIS — Z79899 Other long term (current) drug therapy: Secondary | ICD-10-CM

## 2023-06-27 DIAGNOSIS — G35D Multiple sclerosis, unspecified: Secondary | ICD-10-CM

## 2023-06-27 DIAGNOSIS — G35 Multiple sclerosis: Secondary | ICD-10-CM

## 2023-06-27 DIAGNOSIS — R269 Unspecified abnormalities of gait and mobility: Secondary | ICD-10-CM | POA: Diagnosis not present

## 2023-06-27 DIAGNOSIS — G35A Relapsing-remitting multiple sclerosis: Secondary | ICD-10-CM

## 2023-06-27 DIAGNOSIS — R4789 Other speech disturbances: Secondary | ICD-10-CM

## 2023-06-27 DIAGNOSIS — F418 Other specified anxiety disorders: Secondary | ICD-10-CM

## 2023-06-27 DIAGNOSIS — R5383 Other fatigue: Secondary | ICD-10-CM

## 2023-06-27 MED ORDER — PHENTERMINE HCL 37.5 MG PO CAPS: 37.5000 mg | ORAL_CAPSULE | ORAL | 5 refills | Status: AC

## 2023-06-27 NOTE — Progress Notes (Addendum)
 "  [facility]  PATIENT: Angela Coffey DOB: 20-Dec-1960  REFERRING DOCTOR OR PCP:  Aleck Milch (PCP); Greig Pellant (Neurology) SOURCE: patient, notes from PCP and [facility]; MRI images on PACS, Lab results  _________________________________   HISTORICAL  CHIEF COMPLAINT:  Chief Complaint  Patient presents with   Follow-up    Pt in 10 pt here for MS f/u Pt Pt states no questions or concerns for today's visit Pt states last eye exam was 3 months ago Pt states no falls since last visit     HISTORY OF PRESENT ILLNESS:  Angela Coffey is a 63 y.o. woman with relapsing remitting multiple sclerosis.  Update 06/27/2023  She is on Ocrevus for relapsing remitting MS.    She tolerates Ocrevus infusions well.  However, she experiences some wearing off effect last month 98-month cycles.  We discussed options including switch to Briumvi.   Last infusion was 2 weeks ago.   Last MRI was 06/24/2020 and it did not show any new lesions in the brain or cervical spine   Blood work 06/22/2022 showed normal IgG/IgA/IgM (borderline low IgM)  and CBC with differential.     She has no exacerbations since she has been on Ocrevus.     She is walking fairly well and has no falls this year  She could walk > 1 mile but slows down quicker than others --- she feels most of this is due to her knee more than MS..   She occasionally catches one of her feet when more tired. She uses the bannister for safety on the stairs, but notes it more for knees than balance.   No new weakness  /.  Numbness resolved.    She no longer has a Lhermitte sign.     Spasticity is better for the most part and mostl likely to occur if prolonged  sitting. SHe will take Baclofen   prn and it helps     She has bladder urgency and some dribbling but no frank incontinences.    Vision is stable.    She has fatigue but feels I is affecting her less than it did.   She does worse in heat, however.      Nuvigil  had helped the  fatigue and she tolerates it well.   Mood is doing well on Pristiq and  Wellbutrin.  .   Cognition is doing well for the most part but she has trouble coming up with the right word/name sometimes.       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   She has lost a couple pounds with Wellbutrin/Naloxone      MS history:    In 2008, she had the onset of numbness in the left arm. When symptoms persisted for a few weeks she was referred to Dr. Applegate. Nerve conduction and EMG study was normal so an MRI of the cervical spine was ordered which was abnormal showing foci consistent with MS. An MRI of the brain showed classic MS lesions and she was diagnosed with MS. She was started on Rebif. She had some injection reactions her first year but generally has tolerated it well.  A couple years ago, she saw Dr. Kate Southgate in [place] and Lady was tried. However, she did not feel good when she took Aubagio and went back on Rebif. She has had a couple of sensory exacerbations  and received some IV steroids.   She had her first course of ocrelizumab in March 2018.     Imaging:    MRI of the brain 03/03/2016 and the MRI of the brain from 07/10/2014 show classic periventricular, juxtacortical and deep white matter foci in a pattern and configuration consistent with MS. There are no new lesions and no changes compared to the 2016 MRI.   The MRI of the cervical spine 03/03/2016 showed 3 within the spinal cord, midline and just left of midline at C2-C3, left posteriorly at C4-C5 and right posteriorly at C6-C7. None of the foci appeared to be acute.  MRI brain 03/30/2017 showed Multiple T2/FLAIR hyperintense foci in the periventricular, juxtacortical and deep white matter of both hemispheres and a single punctate focus in the right cerebral hemisphere. These are in a pattern and configuration consistent with chronic demyelinating  plaque associated with multiple sclerosis. None of the foci appears to be acute. When compared to the MRI dated 03/03/2016, there is no interval change.   MRI brain 06/24/2020 showed multiple T2/FLAIR hyperintense foci in the hemispheres and a small focus in the right cerebellar hemisphere and a focus in the upper cervical spinal cord.  These are consistent with chronic demyelinating plaque associated with multiple sclerosis.  They do not appear to be acute.  Compared to the MRI from 03/30/2017, there are no new lesions.  MRI cervicalspine 06/24/2020  T2 hyperintense foci within the spinal cord centrally at C2 and towards the right at C6-C7.  These are consistent with chronic demyelinating plaque associated with multiple sclerosis.  They do not appear to be acute.  They were present on the 2017 MRI. 2.   At C3-C4, there are degenerative changes causing borderline spinal stenosis and moderate bilateral foraminal narrowing with some encroachment upon the C4 nerve roots.  Degenerative changes have progressed at this level compared to the 2017 MRI. 3.   At C4-C5, there are stable degenerative changes causing mild spinal stenosis and right greater than left foraminal narrowing but no nerve root compression. 4.   At C5-C6, there is 2 mm anterolisthesis and other degenerative changes causing mild spinal stenosis and mild bilateral foraminal narrowing but no nerve root compression.  The degenerative changes are stable compared to the 2017 MRI. 5.   At C6-7, there is 2 to 3 mm anterolisthesis and other degenerative changes but no spinal stenosis or nerve root compression.  The degenerative changes have progressed compared to the 2017 MRI.  MRI brain 06/30/2022 showed multiple T2/FLAIR hyperintense foci in the cerebral hemispheres and one focus in the right cerebellar hemisphere in a pattern consistent with chronic demyelinating plaque associated with multiple sclerosis. None of the foci appear to be acute. Compared  to the MRI from 2022, there were no new lesions    REVIEW OF SYSTEMS: Constitutional: No fevers, chills, sweats, or change in appetite.   Notes fatigue Eyes: No visual changes, double vision, eye pain Ear, nose and throat: No hearing loss, ear pain, nasal congestion, sore throat.  Has vertigo.    Cardiovascular: No chest pain, palpitations Respiratory:  No shortness of breath at rest or with exertion.   No wheezes GastrointestinaI: No nausea, vomiting, diarrhea, abdominal pain, fecal incontinence Genitourinary:  No dysuria, urinary retention or frequency.  No nocturia. Musculoskeletal:  Notes neck pain.  No back pain Integumentary: No rash, pruritus, skin lesions Neurological: as above Psychiatric: Notes depression and anxiety Endocrine: No palpitations, diaphoresis, change in appetite, change in weigh or increased  thirst Hematologic/Lymphatic:  No anemia, purpura, petechiae. Allergic/Immunologic: No itchy/runny eyes, nasal congestion, recent allergic reactions, rashes  ALLERGIES: Allergies  Allergen Reactions   Abilify [Aripiprazole] Other (See Comments)    Increased hunger   Cymbalta [Duloxetine Hcl] Other (See Comments)    Stomach upset    HOME MEDICATIONS:  Current Outpatient Medications:    ALPRAZolam (XANAX) 0.25 MG tablet, Take 0.25 mg by mouth 3 (three) times daily as needed for anxiety., Disp: , Rfl: 0   buPROPion (WELLBUTRIN XL) 300 MG 24 hr tablet, Take 300 mg by mouth daily., Disp: , Rfl:    cholecalciferol (VITAMIN D) 1000 UNITS tablet, Take 50,000 Units by mouth once a week., Disp: , Rfl:    clotrimazole-betamethasone (LOTRISONE) cream, Apply 1 Application topically 2 (two) times daily., Disp: , Rfl:    desvenlafaxine (PRISTIQ) 100 MG 24 hr tablet, Take 100 mg by mouth daily. 100 mg daily, Disp: , Rfl:    diclofenac Sodium (VOLTAREN) 1 % GEL, Apply 4 g topically 4 (four) times daily as needed (knee pain)., Disp: , Rfl:    esomeprazole (NEXIUM) 40 MG capsule, Take 40  mg by mouth 2 (two) times daily before a meal., Disp: , Rfl:    fluticasone (FLONASE) 50 MCG/ACT nasal spray, Place 1 spray into both nostrils daily., Disp: , Rfl:    Ibuprofen (MOTRIN PO), Take 800 mg by mouth 2 (two) times daily. Patient takes 200mg  as needed, Disp: , Rfl:    levothyroxine (SYNTHROID, LEVOTHROID) 200 MCG tablet, Take 200 mcg by mouth daily before breakfast. Takes daily - 7days a week, Disp: , Rfl:    montelukast (SINGULAIR) 10 MG tablet, Take 10 mg by mouth at bedtime., Disp: , Rfl:    naltrexone (DEPADE) 50 MG tablet, Take 50 mg by mouth daily., Disp: , Rfl:    nebivolol (BYSTOLIC) 10 MG tablet, Take 20 mg by mouth daily., Disp: , Rfl:    ocrelizumab (OCREVUS) 300 MG/10ML injection, Inject 300 mg into the vein every 6 (six) months. (Patient not taking: Reported on 01/16/2024), Disp: , Rfl:    phentermine  37.5 MG capsule, Take 1 capsule (37.5 mg total) by mouth every morning., Disp: 30 capsule, Rfl: 5   Armodafinil  250 MG tablet, Take 1 tablet (250 mg total) by mouth daily., Disp: 90 tablet, Rfl: 1   aspirin  EC 81 MG tablet, Take 1 tablet (81 mg total) by mouth daily. Swallow whole., Disp: , Rfl:    baclofen  (LIORESAL ) 10 MG tablet, TAKE 1 TABLET(10 MG) BY MOUTH THREE TIMES DAILY, Disp: 270 tablet, Rfl: 0   famotidine  (PEPCID ) 10 MG tablet, Take 10 mg by mouth 3 (three) times daily., Disp: , Rfl:    fluconazole (DIFLUCAN) 100 MG tablet, Take 100 mg by mouth daily., Disp: , Rfl:    losartan  (COZAAR ) 50 MG tablet, Take 1 tablet (50 mg total) by mouth daily., Disp: 90 tablet, Rfl: 0   rosuvastatin  (CRESTOR ) 10 MG tablet, Take 1 tablet (10 mg total) by mouth daily., Disp: 90 tablet, Rfl: 0   Ublituximab-xiiy (BRIUMVI IV), Inject 450 mg into the vein., Disp: , Rfl:  No current facility-administered medications for this visit.  Facility-Administered Medications Ordered in Other Visits:    gadopentetate dimeglumine  (MAGNEVIST ) injection 20 mL, 20 mL, Intravenous, Once PRN, Shalaunda Weatherholtz,  Charlie LABOR, MD  PAST MEDICAL HISTORY: Past Medical History:  Diagnosis Date   Anxiety    Colon polyps    tubular adenomas 2023   Depression    Dyspnea on exertion  04/01/2023   Gait disturbance 05/05/2016   GERD (gastroesophageal reflux disease)    High risk medication use 05/05/2016   Hyperlipidemia    Hypertension    Hypothyroidism    Lhermitte's sign positive 04/04/2019   mild nonobstructive CAD (coronary artery disease), Ca score 133, plaque volume 311mm3, CAD RADS 2 Study 04/2023 04/24/2023   Morbid obesity (HCC)    Multiple sclerosis    Osteoarthritis of both knees    Other fatigue 05/05/2016   Pain in left knee 09/02/2017   Palpitations    Varicose veins of both lower extremities with pain 07/03/2015   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   Vitamin D deficiency     PAST SURGICAL HISTORY: Past Surgical History:  Procedure Laterality Date   CHOLECYSTECTOMY  05/22/2014   [facility]   COLONOSCOPY  10/2021   ENDOVENOUS ABLATION SAPHENOUS VEIN W/ LASER Right 07/03/2015   endovenous laser ablation right greater saphenous vein and stab phlebectomy 10-20 incisions right leg by Krystal Doing MD   ENDOVENOUS ABLATION SAPHENOUS VEIN W/ LASER Left 08/28/2015   endovenous laser ablation left greater saphenous vein and stab phlebectomy 10-20 incisions left leg  by Krystal Doing MD    GASTRIC BYPASS     KNEE ARTHROSCOPY Left 04/19/2013   Procedure: LEFT KNEE ARTHROSCOPY AND DEBRIDEMENT, PARTIAL MEDIAL AND LATERAL MENISCECTOMY AND MEDIAL PATELLOFEMORAL AND LATERAL CHONDROPLASTY ;  Surgeon: Donnice JONETTA Car, MD;  Location: [facility];  Service: Orthopedics;  Laterality: Left;   TONSILLECTOMY  1971    FAMILY HISTORY: Family History  Problem Relation Age of Onset   Lupus Mother    Diabetes Mother    Heart disease Mother    Heart failure Mother    Diabetes Father    Heart disease Father    COPD Father    Colon cancer Maternal Grandfather    Multiple sclerosis Paternal  Great-grandfather     SOCIAL HISTORY:  Social History   Socioeconomic History   Marital status: Married    Spouse name: Not on file   Number of children: 0   Years of education: BS   Highest education level: Not on file  Occupational History   Occupation: [employer]  Tobacco Use   Smoking status: Never   Smokeless tobacco: Never  Vaping Use   Vaping status: Not on file  Substance and Sexual Activity   Alcohol use: No    Alcohol/week: 0.0 standard drinks of alcohol   Drug use: No   Sexual activity: Not on file  Other Topics Concern   Not on file  Social History Narrative   Drinks 1 cup of tea a day    Right handed    Lives with husband   Pt works    Social Drivers of Health   Tobacco Use: Low Risk (01/16/2024)   Patient History    Smoking Tobacco Use: Never    Smokeless Tobacco Use: Never    Passive Exposure: Not on file  Financial Resource Strain: Not on file  Food Insecurity: Not on file  Transportation Needs: Not on file  Physical Activity: Not on file  Stress: Not on file  Social Connections: Not on file  Intimate Partner Violence: Not on file  Depression (EYV7-0): Not on file  Alcohol Screen: Not on file  Housing: Not on file  Utilities: Not on file  Health Literacy: Not on file     PHYSICAL EXAM  Vitals:   06/27/23 1059  BP: 133/78  Pulse: 64  Weight: 251  lb (113.9 kg)  Height: 5' 2 (1.575 m)    Body mass index is 45.91 kg/m.   General: The patient is well-developed and well-nourished and in no acute distress   Neurologic Exam  Mental status: The patient is alert and oriented x 3 at the time of the examination. The patient has apparent normal recent and remote memory, with an apparently normal attention span and concentration ability.   Speech is normal.  Cranial nerves: Extraocular movements are full.  Facial strength and sensation is normal.  Trapezius strength is normal..  No obvious hearing deficits are noted.  Motor:  Muscle  bulk is normal.   Muscle tone is fairly normal..  The muscle strength is 5/5 in all limbs  Sensory: Sensory testing shows normal and symmetric sensation to touch and vibration in the arms and legs..  Coordination: She has good finger-nose-finger but reduced heel-to-shin bilaterally.  Gait and station: Station is normal.  Her gait is arthritic and wide.  The turn is stable..  The tandem gait is mildly wide.. Romberg is negative .   Reflexes: Deep tendon reflexes are symmetric and increased bilaterally at the knees and ankles.  There is no ankle clonus.      ASSESSMENT AND PLAN  Multiple sclerosis, relapsing-remitting  Multiple sclerosis - Plan: IgG, IgA, IgM  High risk medication use - Plan: IgG, IgA, IgM  Gait disturbance  Other fatigue  Verbal fluency disorder  Depression with anxiety   1.   We discussed the switch from Ocrevus to Briumvi to maintain good efficacy with hopefully better tolerability..  The IgM has been low normal.  Will need to recheck  . MRI check every 2 years or so.     May want to consider Mavenclad if IgM drops much more 2.   Continue  baclofen  and other med's 3.   Stay active.   Continue armodafinil  and add phentermine  for weight loss and fatigue/attention.   4.    rtc 6 months, sooner if problems   Cassey Bacigalupo A. Vear, MD, PhD 05/24/2024, 12:52 PM Certified in Neurology, Clinical Neurophysiology, Sleep Medicine, Pain Medicine and Neuroimaging  [facility] [address] [place], [place] [address] [phone]. "

## 2023-06-28 ENCOUNTER — Encounter: Payer: Self-pay | Admitting: Neurology

## 2023-06-28 LAB — IGG, IGA, IGM
IgA/Immunoglobulin A, Serum: 87 mg/dL (ref 87–352)
IgG (Immunoglobin G), Serum: 929 mg/dL (ref 586–1602)
IgM (Immunoglobulin M), Srm: 19 mg/dL — ABNORMAL LOW (ref 26–217)

## 2023-06-30 DIAGNOSIS — F32A Depression, unspecified: Secondary | ICD-10-CM | POA: Insufficient documentation

## 2023-06-30 DIAGNOSIS — M170 Bilateral primary osteoarthritis of knee: Secondary | ICD-10-CM | POA: Insufficient documentation

## 2023-06-30 DIAGNOSIS — K219 Gastro-esophageal reflux disease without esophagitis: Secondary | ICD-10-CM | POA: Insufficient documentation

## 2023-06-30 DIAGNOSIS — K635 Polyp of colon: Secondary | ICD-10-CM | POA: Insufficient documentation

## 2023-06-30 DIAGNOSIS — E039 Hypothyroidism, unspecified: Secondary | ICD-10-CM | POA: Insufficient documentation

## 2023-06-30 DIAGNOSIS — F419 Anxiety disorder, unspecified: Secondary | ICD-10-CM | POA: Insufficient documentation

## 2023-06-30 DIAGNOSIS — E559 Vitamin D deficiency, unspecified: Secondary | ICD-10-CM | POA: Insufficient documentation

## 2023-07-05 ENCOUNTER — Ambulatory Visit: Payer: 59

## 2023-07-05 VITALS — BP 114/68 | HR 75 | Ht 62.0 in | Wt 249.0 lb

## 2023-07-05 DIAGNOSIS — I10 Essential (primary) hypertension: Secondary | ICD-10-CM | POA: Diagnosis not present

## 2023-07-05 DIAGNOSIS — E559 Vitamin D deficiency, unspecified: Secondary | ICD-10-CM

## 2023-07-05 DIAGNOSIS — E782 Mixed hyperlipidemia: Secondary | ICD-10-CM | POA: Diagnosis not present

## 2023-07-05 DIAGNOSIS — I2510 Atherosclerotic heart disease of native coronary artery without angina pectoris: Secondary | ICD-10-CM | POA: Diagnosis not present

## 2023-07-05 DIAGNOSIS — F419 Anxiety disorder, unspecified: Secondary | ICD-10-CM

## 2023-07-05 MED ORDER — ASPIRIN 81 MG PO TBEC: 81.0000 mg | DELAYED_RELEASE_TABLET | Freq: Every day | ORAL | Status: AC

## 2023-07-05 NOTE — Assessment & Plan Note (Signed)
 Improved lipid panel results with recent levels from PCPs office check total cholesterol 182, LDL 78, HDL 78, triglycerides 158. Normal transaminases. Continue Crestor 10 mg once daily.

## 2023-07-05 NOTE — Progress Notes (Signed)
 Cardiology Consultation:    Date:  07/05/2023   ID:  Angela Coffey, DOB [birth year], MRN [id number]  PCP:  Philemon Kingdom, MD  Cardiologist:  Marlyn Corporal Lincoln Ginley, MD   Referring MD: Philemon Kingdom, MD   No chief complaint on file.    ASSESSMENT AND PLAN:   Ms. Schoening is a 63 year old with history of hypertension, hyperlipidemia, multiple sclerosis, hypothyroidism, GERD, depression, anxiety, obesity, osteoarthritis of the knees, palpitations which have reduced in frequency since last visit, CT coronary angiogram from December 2024 showing CAD RADS 2 study of breath mild nonobstructive disease of mid to distal LAD and calcium score of 133 and plaque volume 337 mm cube, small hiatal hernia here for follow-up visit.  Problem List Items Addressed This Visit     Hypertension   Well-controlled. Target below 130 over 80 mmHg. Continue nebivolol 20 mg once daily and losartan 50 mg once daily.       Relevant Medications   aspirin EC 81 MG tablet   Hyperlipidemia   Improved lipid panel results with recent levels from PCPs office check total cholesterol 182, LDL 78, HDL 78, triglycerides 158. Normal transaminases. Continue Crestor 10 mg once daily.       Relevant Medications   aspirin EC 81 MG tablet   mild nonobstructive CAD (coronary artery disease), Ca score 133, plaque volume 372mm3, CAD RADS 2 Study 04/2023 - Primary   Reviewed the findings with her. Discussed role of statins in the setting. She has been taking rosuvastatin consistently as previously prescribed. Tolerating well. Recent lipid panels and LFTs as reviewed below showing improvement and no evident abnormalities. Continue with rosuvastatin 10 mg once daily.  Discussed role of aspirin 81 mg once daily in prevention of MI, CVA.  Potential side effects of bruising, bleeding discussed.  She will give it a try and if tolerates well will continue for prevention.       Relevant Medications   aspirin EC 81  MG tablet   Return to clinic for follow-up with [place] on an as-needed basis. History of Present Illness:    Angela Coffey is a 63 y.o. female who is being seen today for follow-up visit. Last visit with [place] in the office was 04/01/2023. PCP is Philemon Kingdom, MD.  Has history of hypertension, hyperlipidemia, multiple sclerosis, hypothyroidism, GERD, depression, anxiety, obesity, osteoarthritis of the knees, palpitations, CT coronary angiogram with calcium score 133, plaque volume 337 suggest moderate coronary atherosclerosis with CAD RADS 2 study mild nonobstructive disease of mid to distal LAD, extracardiac findings of small hiatal hernia and aortic atherosclerosis observedOn 04/21/2023.  These results were previously shared with her. Today here for the visit by herself.  Mentions she has been feeling well.  No significant symptoms of palpitations.  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  Walks when possible in and around the building.  Osteoarthritis of the knees limits walking for extended periods or at a higher pace.  Denies any symptoms of chest pain or shortness of breath at this time. Tolerating her current medications well. Higher dose of nebivolol 20 mg once daily has improved blood pressures.  She mentions she has purchased Kardia mobile but has not had to use it as she had no palpitations.  Last echocardiogram from July 2020.  [facility] reported LVEF 55 to 60%, trace MR.  Last lipid panel from PCPs office recently noted total cholesterol 182, LDL 78, HDL 78, triglycerides 158. CMP with BUN 21, creatinine 0.85 and normal transaminases.  Prior lipid panel reviewed from October 2023 total cholesterol 215, HDL 75, LDL 125, triglycerides 85.  Past Medical History:  Diagnosis Date   Anxiety    Colon polyps    tubular adenomas 2023   Depression    Dyspnea on exertion 04/01/2023   Gait disturbance 05/05/2016   GERD (gastroesophageal reflux disease)    High  risk medication use 05/05/2016   Hyperlipidemia    Hypertension    Hypothyroidism    Lhermitte's sign positive 04/04/2019   mild nonobstructive CAD (coronary artery disease), Ca score 133, plaque volume 383mm3, CAD RADS 2 Study 04/2023 04/24/2023   Morbid obesity (HCC)    Multiple sclerosis (HCC)    Osteoarthritis of both knees    Other fatigue 05/05/2016   Pain in left knee 09/02/2017   Palpitations    Varicose veins of both lower extremities with pain 07/03/2015   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   Vitamin D deficiency     Past Surgical History:  Procedure Laterality Date   CHOLECYSTECTOMY  05/22/2014   [facility]   COLONOSCOPY  10/2021   ENDOVENOUS ABLATION SAPHENOUS VEIN W/ LASER Right 07/03/2015   endovenous laser ablation right greater saphenous vein and stab phlebectomy 10-20 incisions right leg by Gretta Began MD   ENDOVENOUS ABLATION SAPHENOUS VEIN W/ LASER Left 08/28/2015   endovenous laser ablation left greater saphenous vein and stab phlebectomy 10-20 incisions left leg  by Gretta Began MD    GASTRIC BYPASS     KNEE ARTHROSCOPY Left 04/19/2013   Procedure: LEFT KNEE ARTHROSCOPY AND DEBRIDEMENT, PARTIAL MEDIAL AND LATERAL MENISCECTOMY AND MEDIAL PATELLOFEMORAL AND LATERAL CHONDROPLASTY ;  Surgeon: Shelda Pal, MD;  Location: [facility];  Service: Orthopedics;  Laterality: Left;   TONSILLECTOMY  1971    Current Medications: Current Meds  Medication Sig   ALPRAZolam (XANAX) 0.25 MG tablet Take 0.25 mg by mouth 3 (three) times daily as needed for anxiety.   Armodafinil (NUVIGIL) 250 MG tablet Take 250 mg by mouth daily.   aspirin EC 81 MG tablet Take 1 tablet (81 mg total) by mouth daily. Swallow whole.   baclofen (LIORESAL) 10 MG tablet Take 10 mg by mouth 3 (three) times daily as needed for muscle spasms.   buPROPion (WELLBUTRIN XL) 300 MG 24 hr tablet Take 300 mg by mouth daily.   cholecalciferol (VITAMIN D) 1000 UNITS tablet Take 50,000 Units by mouth once a  week.   clotrimazole-betamethasone (LOTRISONE) cream Apply 1 Application topically 2 (two) times daily.   desvenlafaxine (PRISTIQ) 100 MG 24 hr tablet Take 100 mg by mouth daily. 100 mg daily   diclofenac Sodium (VOLTAREN) 1 % GEL Apply 4 g topically 4 (four) times daily as needed (knee pain).   esomeprazole (NEXIUM) 40 MG capsule Take 40 mg by mouth 2 (two) times daily before a meal.   famotidine (PEPCID) 10 MG tablet Take 10 mg by mouth 3 (three) times daily.   fluconazole (DIFLUCAN) 100 MG tablet Take 100 mg by mouth daily.   fluticasone (FLONASE) 50 MCG/ACT nasal spray Place 1 spray into both nostrils daily.   Ibuprofen (MOTRIN PO) Take 800 mg by mouth 2 (two) times daily. Patient takes 200mg  as needed   levothyroxine (SYNTHROID, LEVOTHROID) 200 MCG tablet Take 200 mcg by mouth daily before breakfast. Takes daily - 7days a week   losartan (COZAAR) 50 MG tablet Take 1 tablet (50 mg total) by mouth daily.   montelukast (SINGULAIR) 10 MG tablet Take 10 mg by mouth  at bedtime.   naltrexone (DEPADE) 50 MG tablet Take 50 mg by mouth daily.   nebivolol (BYSTOLIC) 10 MG tablet Take 20 mg by mouth daily.   ocrelizumab (OCREVUS) 300 MG/10ML injection Inject 300 mg into the vein every 6 (six) months.   phentermine 37.5 MG capsule Take 1 capsule (37.5 mg total) by mouth every morning.   rosuvastatin (CRESTOR) 10 MG tablet Take 1 tablet (10 mg total) by mouth daily.     Allergies:   Abilify [aripiprazole] and Cymbalta [duloxetine hcl]   Social History   Socioeconomic History   Marital status: Married    Spouse name: Not on file   Number of children: 0   Years of education: BS   Highest education level: Not on file  Occupational History   Occupation: [employer]  Tobacco Use   Smoking status: Never   Smokeless tobacco: Never  Vaping Use   Vaping status: Not on file  Substance and Sexual Activity   Alcohol use: No    Alcohol/week: 0.0 standard drinks of alcohol   Drug use: No    Sexual activity: Not on file  Other Topics Concern   Not on file  Social History Narrative   Drinks 1 cup of tea a day    Right handed    Lives with husband   Pt works    Social Drivers of Corporate investment banker Strain: Not on Ship broker Insecurity: Not on file  Transportation Needs: Not on file  Physical Activity: Not on file  Stress: Not on file  Social Connections: Not on file     Family History: The patient's family history includes COPD in her father; Colon cancer in her maternal grandfather; Diabetes in her father and mother; Heart disease in her father and mother; Heart failure in her mother; Lupus in her mother; Multiple sclerosis in her paternal great-grandfather. ROS:   Please see the history of present illness.    All 14 point review of systems negative except as described per history of present illness.  EKGs/Labs/Other Studies Reviewed:    The following studies were reviewed today:   EKG:       Recent Labs: 12/16/2022: Hemoglobin 14.0; Platelets 333 04/01/2023: BUN 19; Creatinine, Ser 0.80; Potassium 4.7; Sodium 138  Recent Lipid Panel No results found for: "CHOL", "TRIG", "HDL", "CHOLHDL", "VLDL", "LDLCALC", "LDLDIRECT"  Physical Exam:    VS:  BP 114/68   Pulse 75   Ht 5\' 2"  (1.575 m)   Wt 249 lb (112.9 kg)   SpO2 98%   BMI 45.54 kg/m     Wt Readings from Last 3 Encounters:  07/05/23 249 lb (112.9 kg)  06/27/23 251 lb (113.9 kg)  04/01/23 245 lb (111.1 kg)     GENERAL:  Well nourished, well developed in no acute distress CARDIAC: RRR, S1 and S2 present, no murmurs, no rubs, no gallops Extremities: No pitting pedal edema. Pulses bilaterally symmetric with radial 2+ and dorsalis pedis 2+ NEUROLOGIC:  Alert and oriented x 3  Medication Adjustments/Labs and Tests Ordered: Current medicines are reviewed at length with the patient today.  Concerns regarding medicines are outlined above.  No orders of the defined types were placed in this  encounter.  Meds ordered this encounter  Medications   aspirin EC 81 MG tablet    Sig: Take 1 tablet (81 mg total) by mouth daily. Swallow whole.    Signed, Cecille Amsterdam, MD, MPH, [facility]. 07/05/2023 4:09 PM  [facility]

## 2023-07-05 NOTE — Assessment & Plan Note (Signed)
 Reviewed the findings with her. Discussed role of statins in the setting. She has been taking rosuvastatin consistently as previously prescribed. Tolerating well. Recent lipid panels and LFTs as reviewed below showing improvement and no evident abnormalities. Continue with rosuvastatin 10 mg once daily.  Discussed role of aspirin 81 mg once daily in prevention of MI, CVA.  Potential side effects of bruising, bleeding discussed.  She will give it a try and if tolerates well will continue for prevention.

## 2023-07-05 NOTE — Patient Instructions (Addendum)
 Medication Instructions:   START: Aspirin 81mg  enteric coated 1 tablet daily   Lab Work: None Ordered If you have labs (blood work) drawn today and your tests are completely normal, you will receive your results only by: MyChart Message (if you have MyChart) OR A paper copy in the mail If you have any lab test that is abnormal or we need to change your treatment, we will call you to review the results.   Testing/Procedures: None Ordered   Follow-Up: At [facility], you and your health needs are our priority.  As part of our continuing mission to provide you with exceptional heart care, we have created designated Provider Care Teams.  These Care Teams include your primary Cardiologist (physician) and Advanced Practice Providers (APPs -  Physician Assistants and Nurse Practitioners) who all work together to provide you with the care you need, when you need it.  We recommend signing up for the patient portal called "MyChart".  Sign up information is provided on this After Visit Summary.  MyChart is used to connect with patients for Virtual Visits (Telemedicine).  Patients are able to view lab/test results, encounter notes, upcoming appointments, etc.  Non-urgent messages can be sent to your provider as well.   To learn more about what you can do with MyChart, go to [email or web address].    Your next appointment:   As needed  The format for your next appointment:   In Person  Provider:   Vern Claude Madireddy   Other Instructions NA

## 2023-07-05 NOTE — Assessment & Plan Note (Signed)
 Well-controlled. Target below 130 over 80 mmHg. Continue nebivolol 20 mg once daily and losartan 50 mg once daily.

## 2023-07-14 ENCOUNTER — Other Ambulatory Visit (HOSPITAL_COMMUNITY): Payer: Self-pay

## 2023-08-17 ENCOUNTER — Other Ambulatory Visit: Payer: Self-pay | Admitting: Neurology

## 2023-08-18 NOTE — Telephone Encounter (Signed)
 Last seen 06/27/23 Follow up scheduled on 01/16/24   Dispensed Days Supply Quantity Provider Pharmacy  ARMODAFINIL 250MG  TABLETS 06/30/2023 30 30 each Sater, Pearletha Furl, MD [facility]     Rx pending to signed

## 2023-09-27 ENCOUNTER — Other Ambulatory Visit: Payer: Self-pay | Admitting: Neurology

## 2023-09-27 ENCOUNTER — Other Ambulatory Visit: Payer: Self-pay

## 2023-10-18 ENCOUNTER — Telehealth: Payer: Self-pay

## 2023-10-18 ENCOUNTER — Other Ambulatory Visit (HOSPITAL_COMMUNITY): Payer: Self-pay

## 2023-10-18 NOTE — Telephone Encounter (Signed)
 Pharmacy Patient Advocate Encounter   Received notification from CoverMyMeds that prior authorization for Armodafinil  250MG  tablets is required/requested.   Insurance verification completed.   The patient is insured through [facility] .   Per test claim: PA required; PA submitted to above mentioned insurance via CoverMyMeds Key/confirmation #/EOC [id number] Status is pending

## 2023-10-18 NOTE — Telephone Encounter (Signed)
 Pharmacy Patient Advocate Encounter  Received notification from [facility] that Prior Authorization for Armodafinil  250MG  tablets has been APPROVED from 10/18/2023 to 10/17/2024   PA #/Case ID/Reference #: PA Case ID #: [id number]

## 2023-11-29 ENCOUNTER — Encounter: Payer: Self-pay | Admitting: Neurology

## 2023-12-05 NOTE — Telephone Encounter (Signed)
 I called Friday, 12/02/2023 and again today at 4:25 PM to discuss the medication.

## 2023-12-06 NOTE — Telephone Encounter (Signed)
 I spoke to Ms. Pruitt about Ocrevus versus Briumvi.  She  Feels more tired the last month of each cycle.  It is possible that this could do better on Briumvi.  Additionally she would prefer a shorter infusion time.  She has had some side effects with steroids causing hypertension and we can do the infusions without this

## 2023-12-08 ENCOUNTER — Telehealth: Payer: Self-pay | Admitting: *Deleted

## 2023-12-08 NOTE — Telephone Encounter (Signed)
 Faxed completed/signed Briumvi start form to [facility]riumvi Patient Support at [phone]. Received fax confirmation. Gave signed orders to [facility] to starting processing/get insurance approval prior to scheduling pt.   Pt aware to be on look out for phone call from [facility] Pt support as well.

## 2023-12-09 NOTE — Telephone Encounter (Signed)
 Gave below fax to [employer]

## 2023-12-21 NOTE — Telephone Encounter (Signed)
 Per [facility] in infusion, patient is scheduled for 12/28/2023 and 01/11/2024.

## 2024-01-12 NOTE — Progress Notes (Signed)
 PATIENT: Angela Coffey DOB: [birth year]  REASON FOR VISIT: follow up HISTORY FROM: patient  Chief Complaint  Patient presents with   RM1/MS    Pt is here Alone. Pt states that she is tolerating her Briumvi. Pt states that she feels a lot better since being on the Briumvi.      HISTORY OF PRESENT ILLNESS:  01/16/24 ALL:  Angela Coffey is a 63 y.o. 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 Last infusion in 01/11/2024 (2nd dose). She did not have to pretreat with solumedrol or Benadryl . She seems to tolerate it better. IgM has been low, last 19. MRI brain stable 06/2022.   She reports doing well. She denies new or worsening symptoms. Gait is stable with cane. She is discussing TKR with Dr Ernie. She is also planning to see Dr Georgina in [place] to consider a robotic assisted knee replacement. She is working with PT for lumbar pain. She is having some right sided lumbar radicular symptoms. She feels that baclofen  helps with muscle spasms, mostly of back. She takes one dose daily, and an additional dose several times a week.    She feels more energized. Less cognitive fog. She continues armodafinil . May switch to phentermine  for a few months if armodafinil  looses effectiveness. Mood is stable on Pristiq and Wellbutrin (PCP). She is sleeping fairly well. She has had more insomnia since retiring from teaching. PCP recently started Rozerem. She continues vitamin D, these labs are monitored by her PCP.  No changes in gait, bowel or bladder function. No vision changes. She stays active with walking. 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   HISTORY: (copied from Dr Duncan previous note)  Angela Coffey is a 63 y.o. woman with relapsing remitting multiple sclerosis.   Update 06/27/2023  She is on Ocrevus for relapsing remitting MS.    She tolerates Ocrevus infusions well.  However, she experiences some wearing off effect last month 39-month cycles.  We discussed options including  switch to Briumvi.   Last infusion was 2 weeks ago.   Last MRI was 06/24/2020 and it did not show any new lesions in the brain or cervical spine   Blood work 06/22/2022 showed normal IgG/IgA/IgM (borderline low IgM)  and CBC with differential.     She has no exacerbations since she has been on Ocrevus.      She is walking fairly well and has no falls this year  She could walk > 1 mile but slows down quicker than others --- she feels most of this is due to her knee more than MS..   She occasionally catches one of her feet when more tired. She uses the bannister for safety on the stairs, but notes it more for knees than balance.   No new weakness  /.  Numbness resolved.    She no longer has a Lhermitte sign.     Spasticity is better for the most part and mostl likely to occur if prolonged  sitting. SHe will take Baclofen   prn and it helps      She has bladder urgency and some dribbling but no frank incontinences.     Vision is stable.     She has fatigue but feels I is affecting her less than it did.   She does worse in heat, however.      Nuvigil  had helped the fatigue and she tolerates it well.   Mood is doing well on Pristiq and  Wellbutrin.  SABRA  Cognition is doing well for the most part but she has trouble coming up with the right word/name sometimes.        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   She has lost a couple pounds with Wellbutrin/Naloxone      MS history:    In 2008, she had the onset of numbness in the left arm. When symptoms persisted for a few weeks she was referred to Dr. Applegate. Nerve conduction and EMG study was normal so an MRI of the cervical spine was ordered which was abnormal showing foci consistent with MS. An MRI of the brain showed classic MS lesions and she was diagnosed with MS. She was started on Rebif. She had some injection reactions her first year but generally has  tolerated it well.  A couple years ago, she saw Dr. Kate Southgate in [place] and Lady was tried. However, she did not feel good when she took Aubagio and went back on Rebif. She has had a couple of sensory exacerbations and received some IV steroids.   She had her first course of ocrelizumab in March 2018.      Imaging:    MRI of the brain 03/03/2016 and the MRI of the brain from 07/10/2014 show classic periventricular, juxtacortical and deep white matter foci in a pattern and configuration consistent with MS. There are no new lesions and no changes compared to the 2016 MRI.    The MRI of the cervical spine 03/03/2016 showed 3 within the spinal cord, midline and just left of midline at C2-C3, left posteriorly at C4-C5 and right posteriorly at C6-C7. None of the foci appeared to be acute.   MRI brain 03/30/2017 showed Multiple T2/FLAIR hyperintense foci in the periventricular, juxtacortical and deep white matter of both hemispheres and a single punctate focus in the right cerebral hemisphere. These are in a pattern and configuration consistent with chronic demyelinating plaque associated with multiple sclerosis. None of the foci appears to be acute. When compared to the MRI dated 03/03/2016, there is no interval change.    MRI brain 06/24/2020 showed multiple T2/FLAIR hyperintense foci in the hemispheres and a small focus in the right cerebellar hemisphere and a focus in the upper cervical spinal cord.  These are consistent with chronic demyelinating plaque associated with multiple sclerosis.  They do not appear to be acute.  Compared to the MRI from 03/30/2017, there are no new lesions.   MRI cervicalspine 06/24/2020  T2 hyperintense foci within the spinal cord centrally at C2 and towards the right at C6-C7.  These are consistent with chronic demyelinating plaque associated with multiple sclerosis.  They do not appear to be acute.  They were present on the 2017 MRI. 2.   At C3-C4, there are degenerative  changes causing borderline spinal stenosis and moderate bilateral foraminal narrowing with some encroachment upon the C4 nerve roots.  Degenerative changes have progressed at this level compared to the 2017 MRI. 3.   At C4-C5, there are stable degenerative changes causing mild spinal stenosis and right greater than left foraminal narrowing but no nerve root compression. 4.   At C5-C6, there is 2 mm anterolisthesis and other degenerative changes causing mild spinal stenosis and mild bilateral foraminal narrowing but no nerve root compression.  The degenerative changes are stable compared to the 2017 MRI. 5.   At C6-7, there is 2  to 3 mm anterolisthesis and other degenerative changes but no spinal stenosis or nerve root compression.  The degenerative changes have progressed compared to the 2017 MRI.   MRI brain 06/30/2022 showed multiple T2/FLAIR hyperintense foci in the cerebral hemispheres and one focus in the right cerebellar hemisphere in a pattern consistent with chronic demyelinating plaque associated with multiple sclerosis. None of the foci appear to be acute. Compared to the MRI from 2022, there were no new lesions    REVIEW OF SYSTEMS: Out of a complete 14 system review of symptoms, the patient complains only of the following symptoms, fatigue, muscle spasms, SOB, sinus pressure and all other reviewed systems are negative.  ALLERGIES: Allergies  Allergen Reactions   Abilify [Aripiprazole] Other (See Comments)    Increased hunger   Cymbalta [Duloxetine Hcl] Other (See Comments)    Stomach upset    HOME MEDICATIONS: Outpatient Medications Prior to Visit  Medication Sig Dispense Refill   ALPRAZolam (XANAX) 0.25 MG tablet Take 0.25 mg by mouth 3 (three) times daily as needed for anxiety.  0   aspirin  EC 81 MG tablet Take 1 tablet (81 mg total) by mouth daily. Swallow whole.     baclofen  (LIORESAL ) 10 MG tablet TAKE 1 TABLET(10 MG) BY MOUTH THREE TIMES DAILY 270 tablet 0   buPROPion  (WELLBUTRIN XL) 300 MG 24 hr tablet Take 300 mg by mouth daily.     cholecalciferol (VITAMIN D) 1000 UNITS tablet Take 50,000 Units by mouth once a week.     clotrimazole-betamethasone (LOTRISONE) cream Apply 1 Application topically 2 (two) times daily.     desvenlafaxine (PRISTIQ) 100 MG 24 hr tablet Take 100 mg by mouth daily. 100 mg daily     diclofenac Sodium (VOLTAREN) 1 % GEL Apply 4 g topically 4 (four) times daily as needed (knee pain).     esomeprazole (NEXIUM) 40 MG capsule Take 40 mg by mouth 2 (two) times daily before a meal.     famotidine  (PEPCID ) 10 MG tablet Take 10 mg by mouth 3 (three) times daily.     fluconazole (DIFLUCAN) 100 MG tablet Take 100 mg by mouth daily.     fluticasone (FLONASE) 50 MCG/ACT nasal spray Place 1 spray into both nostrils daily.     Ibuprofen (MOTRIN PO) Take 800 mg by mouth 2 (two) times daily. Patient takes 200mg  as needed     levothyroxine (SYNTHROID, LEVOTHROID) 200 MCG tablet Take 200 mcg by mouth daily before breakfast. Takes daily - 7days a week     losartan  (COZAAR ) 50 MG tablet Take 1 tablet (50 mg total) by mouth daily. 90 tablet 3   montelukast (SINGULAIR) 10 MG tablet Take 10 mg by mouth at bedtime.     naltrexone (DEPADE) 50 MG tablet Take 50 mg by mouth daily.     nebivolol (BYSTOLIC) 10 MG tablet Take 20 mg by mouth daily.     phentermine  37.5 MG capsule Take 1 capsule (37.5 mg total) by mouth every morning. 30 capsule 5   rosuvastatin  (CRESTOR ) 10 MG tablet Take 1 tablet (10 mg total) by mouth daily. 90 tablet 3   Ublituximab-xiiy (BRIUMVI IV) Inject 450 mg into the vein.     Armodafinil  250 MG tablet TAKE 1 TABLET(250 MG) BY MOUTH DAILY 90 tablet 1   ocrelizumab (OCREVUS) 300 MG/10ML injection Inject 300 mg into the vein every 6 (six) months. (Patient not taking: Reported on 01/16/2024)     Facility-Administered Medications Prior to Visit  Medication Dose  Route Frequency Provider Last Rate Last Admin   gadopentetate dimeglumine   (MAGNEVIST ) injection 20 mL  20 mL Intravenous Once PRN Angela Charlie LABOR, Angela Coffey        PAST MEDICAL HISTORY: Past Medical History:  Diagnosis Date   Anxiety    Colon polyps    tubular adenomas 2023   Depression    Dyspnea on exertion 04/01/2023   Gait disturbance 05/05/2016   GERD (gastroesophageal reflux disease)    High risk medication use 05/05/2016   Hyperlipidemia    Hypertension    Hypothyroidism    Lhermitte's sign positive 04/04/2019   mild nonobstructive CAD (coronary artery disease), Ca score 133, plaque volume 341mm3, CAD RADS 2 Study 04/2023 04/24/2023   Morbid obesity (HCC)    Multiple sclerosis (HCC)    Osteoarthritis of both knees    Other fatigue 05/05/2016   Pain in left knee 09/02/2017   Palpitations    Varicose veins of both lower extremities with pain 07/03/2015   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   Vitamin D deficiency     PAST SURGICAL HISTORY: Past Surgical History:  Procedure Laterality Date   CHOLECYSTECTOMY  05/22/2014   [facility]   COLONOSCOPY  10/2021   ENDOVENOUS ABLATION SAPHENOUS VEIN W/ LASER Right 07/03/2015   endovenous laser ablation right greater saphenous vein and stab phlebectomy 10-20 incisions right leg by Angela Coffey   ENDOVENOUS ABLATION SAPHENOUS VEIN W/ LASER Left 08/28/2015   endovenous laser ablation left greater saphenous vein and stab phlebectomy 10-20 incisions left leg  by Angela Coffey    GASTRIC BYPASS     KNEE ARTHROSCOPY Left 04/19/2013   Procedure: LEFT KNEE ARTHROSCOPY AND DEBRIDEMENT, PARTIAL MEDIAL AND LATERAL MENISCECTOMY AND MEDIAL PATELLOFEMORAL AND LATERAL CHONDROPLASTY ;  Surgeon: Angela JONETTA Car, Angela Coffey;  Location: [facility];  Service: Orthopedics;  Laterality: Left;   TONSILLECTOMY  1971    FAMILY HISTORY: Family History  Problem Relation Age of Onset   Lupus Mother    Diabetes Mother    Heart disease Mother    Heart failure Mother    Diabetes Father    Heart disease Father    COPD Father    Colon  cancer Maternal Grandfather    Multiple sclerosis Paternal Great-grandfather     SOCIAL HISTORY: Social History   Socioeconomic History   Marital status: Married    Spouse name: Not on file   Number of children: 0   Years of education: BS   Highest education level: Not on file  Occupational History   Occupation: [employer]  Tobacco Use   Smoking status: Never   Smokeless tobacco: Never  Vaping Use   Vaping status: Not on file  Substance and Sexual Activity   Alcohol use: No    Alcohol/week: 0.0 standard drinks of alcohol   Drug use: No   Sexual activity: Not on file  Other Topics Concern   Not on file  Social History Narrative   Drinks 1 cup of tea a day    Right handed    Lives with husband   Pt works    Social Drivers of Corporate investment banker Strain: Not on file  Food Insecurity: Not on file  Transportation Needs: Not on file  Physical Activity: Not on file  Stress: Not on file  Social Connections: Not on file  Intimate Partner Violence: Not on file      PHYSICAL EXAM  Vitals:   01/16/24 1043  BP: 111/78  Pulse: 73  SpO2: 99%  Weight: 246 lb 8 oz (111.8 kg)  Height: 5' 2 (1.575 m)     Body mass index is 45.09 kg/m.  Generalized:  Well developed, in no acute distress  HEENT: Oral mucosa is pink and moist. Mild erythema to the throat, Pain in the frontal and maxillary sinuses. No adenopathy is noted. Cardiology: normal rate and rhythm, no murmur noted Respiratory: clear to auscultation bilaterally  Neurological examination  Mentation: Alert oriented to time, place, history taking. Follows all commands speech and language fluent Cranial nerve II-XII: Pupils were equal round reactive to light. Extraocular movements were full, visual field were full on confrontational test. Facial sensation and strength were normal. Uvula tongue midline. Head turning and shoulder shrug  were normal and symmetric. Motor: The motor testing reveals 5 over 5  strength of all 4 extremities. Good symmetric motor tone is noted throughout.  Sensory: Sensory testing is intact to soft touch on all 4 extremities. No evidence of extinction is noted.  Coordination: Cerebellar testing reveals good finger-nose-finger and heel-to-shin bilaterally.  Gait and station: wide, arthritic gait  DIAGNOSTIC DATA (LABS, IMAGING, TESTING) - I reviewed patient records, labs, notes, testing and imaging myself where available.      No data to display           Lab Results  Component Value Date   WBC 6.6 12/16/2022   HGB 14.0 12/16/2022   HCT 42.1 12/16/2022   MCV 89 12/16/2022   PLT 333 12/16/2022      Component Value Date/Time   NA 138 04/01/2023 1552   K 4.7 04/01/2023 1552   CL 98 04/01/2023 1552   CO2 23 04/01/2023 1552   GLUCOSE 84 04/01/2023 1552   GLUCOSE 93 04/19/2013 0600   BUN 19 04/01/2023 1552   CREATININE 0.80 04/01/2023 1552   CALCIUM  9.7 04/01/2023 1552   PROT 6.4 10/02/2019 1611   ALBUMIN 4.3 10/02/2019 1611   AST 12 10/02/2019 1611   ALT 10 10/02/2019 1611   ALKPHOS 116 10/02/2019 1611   BILITOT 0.4 10/02/2019 1611   GFRNONAA 76 10/02/2019 1611   GFRAA 88 10/02/2019 1611   No results found for: CHOL, HDL, LDLCALC, LDLDIRECT, TRIG, CHOLHDL No results found for: YHAJ8R No results found for: VITAMINB12 No results found for: TSH     ASSESSMENT AND PLAN 63 y.o. year old female  has a past medical history of Anxiety, Colon polyps, Depression, Dyspnea on exertion (04/01/2023), Gait disturbance (05/05/2016), GERD (gastroesophageal reflux disease), High risk medication use (05/05/2016), Hyperlipidemia, Hypertension, Hypothyroidism, Lhermitte's sign positive (04/04/2019), mild nonobstructive CAD (coronary artery disease), Ca score 133, plaque volume 372mm3, CAD RADS 2 Study 04/2023 (04/24/2023), Morbid obesity (HCC), Multiple sclerosis (HCC), Osteoarthritis of both knees, Other fatigue (05/05/2016), Pain in left knee  (09/02/2017), Palpitations, Varicose veins of both lower extremities with pain (07/03/2015), 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 here with     ICD-10-CM   1. Multiple sclerosis (HCC)  G35 CBC with Differential/Platelets    IgG, IgA, IgM    2. Relapsing remitting multiple sclerosis (HCC)  G35     3. High risk medication use  Z79.899     4. Gait disturbance  R26.9     5. Other fatigue  R53.83     6. Depression with anxiety  F41.8        Since he is doing very well on Briumvi infusions every 6 months.  We will continue current treatment plan. We will  continue armodafinil  250 mg for MS fatigue.  May alternate with phentermine  37.5 daily if needed. May continue baclofen  upt to TID as needed for muscle spasms. We will update labs today. Last MRI stable in 2024. She was encouraged to follow up closely with PCP. She was encouraged to stay active. Well balanced diet and regular exercise encouraged. She will follow up in 6 months, sooner if needed. She verbalizes understanding and agreement with this plan.    Orders Placed This Encounter  Procedures   CBC with Differential/Platelets   IgG, IgA, IgM     Meds ordered this encounter  Medications   Armodafinil  250 MG tablet    Sig: Take 1 tablet (250 mg total) by mouth daily.    Dispense:  90 tablet    Refill:  1    Supervising Provider:   INES ONETHA NOVAK [[id number]]       Angela Onfjk, Angela Coffey 01/16/2024, 11:09 AM [facility] [address] [place], [place] [address] [phone]

## 2024-01-12 NOTE — Patient Instructions (Signed)
Below is our plan:  We will continue current treatment plan. We will update labs.   Please make sure you are staying well hydrated. I recommend 50-60 ounces daily. Well balanced diet and regular exercise encouraged. Consistent sleep schedule with 6-8 hours recommended.   Please continue follow up with care team as directed.   Follow up with Dr Felecia Shelling in 6 months   You may receive a survey regarding today's visit. I encourage you to leave honest feed back as I do use this information to improve patient care. Thank you for seeing me today!

## 2024-01-13 ENCOUNTER — Other Ambulatory Visit: Payer: Self-pay | Admitting: Neurology

## 2024-01-13 ENCOUNTER — Other Ambulatory Visit: Payer: Self-pay

## 2024-01-16 ENCOUNTER — Encounter: Payer: Self-pay | Admitting: Family Medicine

## 2024-01-16 ENCOUNTER — Ambulatory Visit (INDEPENDENT_AMBULATORY_CARE_PROVIDER_SITE_OTHER): Payer: 59 | Admitting: Family Medicine

## 2024-01-16 VITALS — BP 111/78 | HR 73 | Ht 62.0 in | Wt 246.5 lb

## 2024-01-16 DIAGNOSIS — R269 Unspecified abnormalities of gait and mobility: Secondary | ICD-10-CM

## 2024-01-16 DIAGNOSIS — G35 Multiple sclerosis: Secondary | ICD-10-CM | POA: Diagnosis not present

## 2024-01-16 DIAGNOSIS — Z79899 Other long term (current) drug therapy: Secondary | ICD-10-CM | POA: Diagnosis not present

## 2024-01-16 DIAGNOSIS — R5383 Other fatigue: Secondary | ICD-10-CM

## 2024-01-16 DIAGNOSIS — F418 Other specified anxiety disorders: Secondary | ICD-10-CM

## 2024-01-16 MED ORDER — ARMODAFINIL 250 MG PO TABS: 250.0000 mg | ORAL_TABLET | Freq: Every day | ORAL | 1 refills | Status: AC

## 2024-01-17 ENCOUNTER — Ambulatory Visit: Payer: Self-pay | Admitting: Family Medicine

## 2024-01-17 LAB — CBC WITH DIFFERENTIAL/PLATELET
Basophils Absolute: 0.1 x10E3/uL (ref 0.0–0.2)
Basos: 1 %
EOS (ABSOLUTE): 0.1 x10E3/uL (ref 0.0–0.4)
Eos: 2 %
Hematocrit: 43.7 % (ref 34.0–46.6)
Hemoglobin: 14 g/dL (ref 11.1–15.9)
Immature Grans (Abs): 0 x10E3/uL (ref 0.0–0.1)
Immature Granulocytes: 0 %
Lymphocytes Absolute: 2 x10E3/uL (ref 0.7–3.1)
Lymphs: 23 %
MCH: 30.2 pg (ref 26.6–33.0)
MCHC: 32 g/dL (ref 31.5–35.7)
MCV: 94 fL (ref 79–97)
Monocytes Absolute: 0.8 x10E3/uL (ref 0.1–0.9)
Monocytes: 9 %
Neutrophils Absolute: 5.6 x10E3/uL (ref 1.4–7.0)
Neutrophils: 64 %
Platelets: 380 x10E3/uL (ref 150–450)
RBC: 4.64 x10E6/uL (ref 3.77–5.28)
RDW: 12 % (ref 11.7–15.4)
WBC: 8.5 x10E3/uL (ref 3.4–10.8)

## 2024-01-17 LAB — IGG, IGA, IGM
IgA/Immunoglobulin A, Serum: 84 mg/dL — ABNORMAL LOW (ref 87–352)
IgG (Immunoglobin G), Serum: 989 mg/dL (ref 586–1602)
IgM (Immunoglobulin M), Srm: 20 mg/dL — ABNORMAL LOW (ref 26–217)

## 2024-03-12 ENCOUNTER — Encounter: Payer: Self-pay | Admitting: Radiology

## 2024-04-02 ENCOUNTER — Other Ambulatory Visit: Payer: Self-pay

## 2024-04-03 ENCOUNTER — Other Ambulatory Visit: Payer: Self-pay

## 2024-04-19 ENCOUNTER — Other Ambulatory Visit: Payer: Self-pay | Admitting: Neurology

## 2024-04-25 NOTE — Telephone Encounter (Signed)
 Last seen on 01/16/24 Follow up scheduled on 08/20/24

## 2024-05-24 ENCOUNTER — Encounter: Payer: Self-pay | Admitting: Neurology

## 2024-05-24 ENCOUNTER — Telehealth: Payer: Self-pay | Admitting: *Deleted

## 2024-05-24 NOTE — Telephone Encounter (Signed)
" °  Updated dx code placed on form for Briumvi.  "

## 2024-05-24 NOTE — Telephone Encounter (Signed)
 Received fax from [facility] requesting updated MS Dx code from Dr Vear.

## 2024-06-07 ENCOUNTER — Telehealth: Payer: Self-pay | Admitting: *Deleted

## 2024-06-07 NOTE — Telephone Encounter (Signed)
" °  Order given to kim in [facility]  "

## 2024-06-13 ENCOUNTER — Other Ambulatory Visit: Payer: Self-pay

## 2024-08-20 ENCOUNTER — Ambulatory Visit: Admitting: Neurology
